# Patient Record
Sex: Female | Born: 1955
Health system: Southern US, Community
[De-identification: ages and names within clinical notes are randomized; demographics above are authoritative.]

## PROBLEM LIST (undated history)

## (undated) DIAGNOSIS — Z86010 Personal history of colonic polyps: Secondary | ICD-10-CM

## (undated) DIAGNOSIS — E785 Hyperlipidemia, unspecified: Secondary | ICD-10-CM

## (undated) DIAGNOSIS — K5792 Diverticulitis of intestine, part unspecified, without perforation or abscess without bleeding: Secondary | ICD-10-CM

## (undated) DIAGNOSIS — U071 COVID-19: Secondary | ICD-10-CM

## (undated) DIAGNOSIS — O159 Eclampsia, unspecified as to time period: Secondary | ICD-10-CM

## (undated) DIAGNOSIS — I10 Essential (primary) hypertension: Secondary | ICD-10-CM

## (undated) DIAGNOSIS — R011 Cardiac murmur, unspecified: Secondary | ICD-10-CM

## (undated) DIAGNOSIS — C801 Malignant (primary) neoplasm, unspecified: Secondary | ICD-10-CM

## (undated) HISTORY — DX: Eclampsia, unspecified as to time period: O15.9

## (undated) HISTORY — DX: Personal history of colonic polyps: Z86.010

## (undated) HISTORY — PX: HERNIA REPAIR: SHX51

## (undated) HISTORY — PX: POLYPECTOMY: SHX149

## (undated) HISTORY — PX: COLONOSCOPY: SHX174

## (undated) HISTORY — DX: Malignant (primary) neoplasm, unspecified: C80.1

## (undated) HISTORY — PX: OTHER SURGICAL HISTORY: SHX169

## (undated) HISTORY — DX: COVID-19: U07.1

## (undated) HISTORY — DX: Diverticulitis of intestine, part unspecified, without perforation or abscess without bleeding: K57.92

## (undated) HISTORY — DX: Hyperlipidemia, unspecified: E78.5

## (undated) HISTORY — DX: Cardiac murmur, unspecified: R01.1

## (undated) HISTORY — DX: Essential (primary) hypertension: I10

---

## 2020-11-03 DIAGNOSIS — H527 Unspecified disorder of refraction: Secondary | ICD-10-CM | POA: Diagnosis not present

## 2020-11-29 ENCOUNTER — Encounter: Payer: Self-pay | Admitting: Internal Medicine

## 2020-11-29 ENCOUNTER — Ambulatory Visit (INDEPENDENT_AMBULATORY_CARE_PROVIDER_SITE_OTHER): Payer: Medicare Other | Admitting: Internal Medicine

## 2020-11-29 VITALS — BP 143/87 | HR 99 | Temp 98.5°F | Wt 203.5 lb

## 2020-11-29 DIAGNOSIS — Z1211 Encounter for screening for malignant neoplasm of colon: Secondary | ICD-10-CM

## 2020-11-29 DIAGNOSIS — S0093XA Contusion of unspecified part of head, initial encounter: Secondary | ICD-10-CM | POA: Diagnosis not present

## 2020-11-29 DIAGNOSIS — Z0001 Encounter for general adult medical examination with abnormal findings: Secondary | ICD-10-CM

## 2020-11-29 DIAGNOSIS — I10 Essential (primary) hypertension: Secondary | ICD-10-CM | POA: Insufficient documentation

## 2020-11-29 DIAGNOSIS — E782 Mixed hyperlipidemia: Secondary | ICD-10-CM

## 2020-11-29 DIAGNOSIS — Z91030 Bee allergy status: Secondary | ICD-10-CM | POA: Insufficient documentation

## 2020-11-29 DIAGNOSIS — S0093XD Contusion of unspecified part of head, subsequent encounter: Secondary | ICD-10-CM

## 2020-11-29 DIAGNOSIS — R030 Elevated blood-pressure reading, without diagnosis of hypertension: Secondary | ICD-10-CM | POA: Diagnosis not present

## 2020-11-29 DIAGNOSIS — E785 Hyperlipidemia, unspecified: Secondary | ICD-10-CM | POA: Insufficient documentation

## 2020-11-29 HISTORY — DX: Contusion of unspecified part of head, subsequent encounter: S00.93XD

## 2020-11-29 HISTORY — DX: Encounter for screening for malignant neoplasm of colon: Z12.11

## 2020-11-29 MED ORDER — EPINEPHRINE 0.3 MG/0.3ML IJ SOAJ: 0.3000 mg | INTRAMUSCULAR | 3 refills | Status: DC | PRN

## 2020-11-29 NOTE — Assessment & Plan Note (Signed)
Mentions request for referral for colonoscopy. Mentions her previous GI physician recommended repeat colonoscopyi n 5 years. Previous record show 2 polyps removed during colonoscopy 5 years prior. Unable to find pathology result. Will place referral for GI  - Referral to GI for colonoscopy

## 2020-11-29 NOTE — Assessment & Plan Note (Addendum)
Ms.Gladu is a 65 yo F presenting to [facility] to establish care. She states that she recently moved from [place] and is looking to establish with a primary care provider. She takes no medications currently at home. She states she is severely allergic to bee venom and keeps epi-pen in variety of locations, including her car and golf bag. She requests refill of the epi-pens.  - Allergy list update - Epi-pen script provided

## 2020-11-29 NOTE — Assessment & Plan Note (Signed)
BP Readings from Last 3 Encounters:  11/29/20 (!) 143/87   Noted to have elevated bp this visit. Mentions at home readings previously of around 091U systolic. Denies any chest pain, palpitations, blurry vision. Discussed current ASCVD risk score (8%) and option of starting anti-hypertensive therapy to reduce risk. Miranda Lopez expressed interest in attempting lifestyle modifications first.   - Information on DASH provided - Monitor

## 2020-11-29 NOTE — Patient Instructions (Signed)
Dear Miranda Lopez,  Thank you for allowing [place] to provide your care today. Today we discussed your epi pen and need for colonoscopy    I have ordered no labs for you. I will call if any are abnormal.    Today we made no changes to your medications:    Please follow-up in 6 months.    Please call the internal medicine center clinic if you have any questions or concerns, we may be able to help and keep you from a long and expensive emergency room wait. Our clinic and after hours phone number is [phone], the best time to call is Monday through Friday 9 am to 4 pm but there is always someone available 24/7 if you have an emergency. If you need medication refills please notify your pharmacy one week in advance and they will send [place] a request.    If you have not gotten the COVID vaccine, I recommend doing so:  You may get it at your local [facility] or [facility] OR To schedule an appointment for a COVID vaccine or be added to the vaccine wait list: Go to [email or web address]   OR Go to [email or web address]                  OR Call [phone]                                     OR Call [phone] and select Option 2  Thank you for choosing [facility]   [email or web address]  DASH Eating Plan DASH stands for Dietary Approaches to Stop Hypertension. The DASH eating plan is a healthy eating plan that has been shown to:  Reduce high blood pressure (hypertension).  Reduce your risk for type 2 diabetes, heart disease, and stroke.  Help with weight loss. What are tips for following this plan? Reading food labels  Check food labels for the amount of salt (sodium) per serving. Choose foods with less than 5 percent of the Daily Value of sodium. Generally, foods with less than 300 milligrams (mg) of sodium per serving fit into this eating plan.  To find whole grains, look for the word "whole" as the first word in the  ingredient list. Shopping  Buy products labeled as "low-sodium" or "no salt added."  Buy fresh foods. Avoid canned foods and pre-made or frozen meals. Cooking  Avoid adding salt when cooking. Use salt-free seasonings or herbs instead of table salt or sea salt. Check with your health care provider or pharmacist before using salt substitutes.  Do not fry foods. Cook foods using healthy methods such as baking, boiling, grilling, roasting, and broiling instead.  Cook with heart-healthy oils, such as olive, canola, avocado, soybean, or sunflower oil. Meal planning  Eat a balanced diet that includes: ? 4 or more servings of fruits and 4 or more servings of vegetables each day. Try to fill one-half of your plate with fruits and vegetables. ? 6-8 servings of whole grains each day. ? Less than 6 oz (170 g) of lean meat, poultry, or fish each day. A 3-oz (85-g) serving of meat is about the same size as a deck of cards. One egg equals 1 oz (28 g). ? 2-3 servings of low-fat dairy each day. One serving is 1 cup (237 mL). ? 1 serving of nuts, seeds, or beans 5 times each week. ? 2-3 servings of heart-healthy fats. Healthy fats called  omega-3 fatty acids are found in foods such as walnuts, flaxseeds, fortified milks, and eggs. These fats are also found in cold-water fish, such as sardines, salmon, and mackerel.  Limit how much you eat of: ? Canned or prepackaged foods. ? Food that is high in trans fat, such as some fried foods. ? Food that is high in saturated fat, such as fatty meat. ? Desserts and other sweets, sugary drinks, and other foods with added sugar. ? Full-fat dairy products.  Do not salt foods before eating.  Do not eat more than 4 egg yolks a week.  Try to eat at least 2 vegetarian meals a week.  Eat more home-cooked food and less restaurant, buffet, and fast food.   Lifestyle  When eating at a restaurant, ask that your food be prepared with less salt or no salt, if  possible.  If you drink alcohol: ? Limit how much you use to:  0-1 drink a day for women who are not pregnant.  0-2 drinks a day for men. ? Be aware of how much alcohol is in your drink. In the [place] one drink equals one 12 oz bottle of beer (355 mL), one 5 oz glass of wine (148 mL), or one 1 oz glass of hard liquor (44 mL). General information  Avoid eating more than 2,300 mg of salt a day. If you have hypertension, you may need to reduce your sodium intake to 1,500 mg a day.  Work with your health care provider to maintain a healthy body weight or to lose weight. Ask what an ideal weight is for you.  Get at least 30 minutes of exercise that causes your heart to beat faster (aerobic exercise) most days of the week. Activities may include walking, swimming, or biking.  Work with your health care provider or dietitian to adjust your eating plan to your individual calorie needs. What foods should I eat? Fruits All fresh, dried, or frozen fruit. Canned fruit in natural juice (without added sugar). Vegetables Fresh or frozen vegetables (raw, steamed, roasted, or grilled). Low-sodium or reduced-sodium tomato and vegetable juice. Low-sodium or reduced-sodium tomato sauce and tomato paste. Low-sodium or reduced-sodium canned vegetables. Grains Whole-grain or whole-wheat bread. Whole-grain or whole-wheat pasta. Brown rice. [place]. Bulgur. Whole-grain and low-sodium cereals. Pita bread. Low-fat, low-sodium crackers. Whole-wheat flour tortillas. Meats and other proteins Skinless chicken or [place]. Ground chicken or [place]. Pork with fat trimmed off. Fish and seafood. Egg whites. Dried beans, peas, or lentils. Unsalted nuts, nut butters, and seeds. Unsalted canned beans. Lean cuts of beef with fat trimmed off. Low-sodium, lean precooked or cured meat, such as sausages or meat loaves. Dairy Low-fat (1%) or fat-free (skim) milk. Reduced-fat, low-fat, or fat-free cheeses. Nonfat, low-sodium  ricotta or cottage cheese. Low-fat or nonfat yogurt. Low-fat, low-sodium cheese. Fats and oils Soft margarine without trans fats. Vegetable oil. Reduced-fat, low-fat, or light mayonnaise and salad dressings (reduced-sodium). Canola, safflower, olive, avocado, soybean, and sunflower oils. Avocado. Seasonings and condiments Herbs. Spices. Seasoning mixes without salt. Other foods Unsalted popcorn and pretzels. Fat-free sweets. The items listed above may not be a complete list of foods and beverages you can eat. Contact a dietitian for more information. What foods should I avoid? Fruits Canned fruit in a light or heavy syrup. Fried fruit. Fruit in cream or butter sauce. Vegetables Creamed or fried vegetables. Vegetables in a cheese sauce. Regular canned vegetables (not low-sodium or reduced-sodium). Regular canned tomato sauce and paste (not low-sodium or reduced-sodium). Regular  tomato and vegetable juice (not low-sodium or reduced-sodium). Angie Fava. Olives. Grains Baked goods made with fat, such as croissants, muffins, or some breads. Dry pasta or rice meal packs. Meats and other proteins Fatty cuts of meat. Ribs. Fried meat. Berniece Salines. Bologna, salami, and other precooked or cured meats, such as sausages or meat loaves. Fat from the back of a pig (fatback). Bratwurst. Salted nuts and seeds. Canned beans with added salt. Canned or smoked fish. Whole eggs or egg yolks. Chicken or [place] with skin. Dairy Whole or 2% milk, cream, and half-and-half. Whole or full-fat cream cheese. Whole-fat or sweetened yogurt. Full-fat cheese. Nondairy creamers. Whipped toppings. Processed cheese and cheese spreads. Fats and oils Butter. Stick margarine. Lard. Shortening. Ghee. Bacon fat. Tropical oils, such as coconut, palm kernel, or palm oil. Seasonings and condiments Onion salt, garlic salt, seasoned salt, table salt, and sea salt. Worcestershire sauce. Tartar sauce. Barbecue sauce. Teriyaki sauce. Soy sauce,  including reduced-sodium. Steak sauce. Canned and packaged gravies. Fish sauce. Oyster sauce. Cocktail sauce. Store-bought horseradish. Ketchup. Mustard. Meat flavorings and tenderizers. Bouillon cubes. Hot sauces. Pre-made or packaged marinades. Pre-made or packaged taco seasonings. Relishes. Regular salad dressings. Other foods Salted popcorn and pretzels. The items listed above may not be a complete list of foods and beverages you should avoid. Contact a dietitian for more information. Where to find more information  National Heart, Lung, and Blood Institute: https://wilson-eaton.com/  American Heart Association: www.heart.org  Academy of Nutrition and Dietetics: www.eatright.[place]: www.kidney.org Summary  The DASH eating plan is a healthy eating plan that has been shown to reduce high blood pressure (hypertension). It may also reduce your risk for type 2 diabetes, heart disease, and stroke.  When on the DASH eating plan, aim to eat more fresh fruits and vegetables, whole grains, lean proteins, low-fat dairy, and heart-healthy fats.  With the DASH eating plan, you should limit salt (sodium) intake to 2,300 mg a day. If you have hypertension, you may need to reduce your sodium intake to 1,500 mg a day.  Work with your health care provider or dietitian to adjust your eating plan to your individual calorie needs. This information is not intended to replace advice given to you by your health care provider. Make sure you discuss any questions you have with your health care provider. Document Revised: 07/16/2019 Document Reviewed: 07/16/2019 Elsevier Patient Education  2021 [employer].

## 2020-11-29 NOTE — Assessment & Plan Note (Addendum)
She mentions that 10 days prior, she hit side of her head against a chair. Denies any loc, blurry vision, headaches. Denies any significant bruising or swelling. She states she wanted physician to be aware due to fear of intracranial bleed. She denies any pain.  A/P No evidence of significant trauma or neurological sequelae - Monitor

## 2020-11-29 NOTE — Assessment & Plan Note (Signed)
06/27/20 Total Cholesterol 220 HDL 75 Triglycerides 95 LDL-C 125  Records show LDL above 100. 10-year ASCVD risk calculated to be 8%. Discussed with Miranda Lopez regarding option of starting moderate intensity statin vs lifestyle modifications. Miranda Lopez states she wishes to continue to lose weight and change diet for treatment for now.  - Advised on lifestyle modifications for HLD

## 2020-11-29 NOTE — Progress Notes (Signed)
   CC: Need for colon cancer screening  HPI: Miranda Lopez is a 65 y.o. with PMH listed below presenting to [facility] to establish care. Please see problem based assessment and plan for further details.  Family History: Aunt had breast cancer. No other clinically significant family history.  Social History: Recently moved from [place]. 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 Has two dogs. 2 drinks daily. Denies tobacco, illicit substance use.  Review of Systems: Review of Systems  Constitutional: Negative for chills, fever and malaise/fatigue.  Eyes: Negative for blurred vision.  Respiratory: Negative for shortness of breath.   Cardiovascular: Negative for chest pain.  Gastrointestinal: Negative for constipation, diarrhea, nausea and vomiting.  Neurological: Negative for dizziness, sensory change and headaches.  All other systems reviewed and are negative.   Physical Exam: Vitals:   11/29/20 1330  BP: (!) 143/87  Pulse: 99  Temp: 98.5 F (36.9 C)  TempSrc: Oral  SpO2: 97%  Weight: 203 lb 8 oz (92.3 kg)   Gen: Well-developed, well nourished, NAD HEENT: NCAT head, hearing intact CV: RRR, S1, S2 normal Pulm: CTAB, No rales, no wheezes Extm: ROM intact, Peripheral pulses intact, No peripheral edema Skin: Dry, Warm, normal turgor  Assessment & Plan:   Contusion of head, initial encounter She mentions that 10 days prior, she hit side of her head against a chair. Denies any loc, blurry vision, headaches. Denies any significant bruising or swelling. She states she wanted physician to be aware due to fear of intracranial bleed. She denies any pain.  A/P No evidence of significant trauma or neurological sequelae - Monitor    Screening for colon cancer Mentions request for referral for colonoscopy. Mentions her previous GI physician recommended repeat colonoscopyi n 5 years. Previous record show 2 polyps removed during colonoscopy 5 years  prior. Unable to find pathology result. Will place referral for GI  - Referral to GI for colonoscopy  Moderate mixed hyperlipidemia not requiring statin therapy 06/27/20 Total Cholesterol 220 HDL 75 Triglycerides 95 LDL-C 125  Records show LDL above 100. 10-year ASCVD risk calculated to be 8%. Discussed with Miranda Lopez regarding option of starting moderate intensity statin vs lifestyle modifications. Miranda Lopez states she wishes to continue to lose weight and change diet for treatment for now.  - Advised on lifestyle modifications for HLD  Elevated BP without diagnosis of hypertension BP Readings from Last 3 Encounters:  11/29/20 (!) 143/87   Noted to have elevated bp this visit. Mentions at home readings previously of around 277A systolic. Denies any chest pain, palpitations, blurry vision. Discussed current ASCVD risk score (8%) and option of starting anti-hypertensive therapy to reduce risk. Miranda Lopez expressed interest in attempting lifestyle modifications first.   - Information on DASH provided - Monitor  Allergy to honey bee venom Miranda Lopez is a 65 yo F presenting to [facility] to establish care. She states that she recently moved from [place] and is looking to establish with a primary care provider. She takes no medications currently at home. She states she is severely allergic to bee venom and keeps epi-pen in variety of locations, including her car and golf bag. She requests refill of the epi-pens.  - Allergy list update - Epi-pen script provided    Patient discussed with Dr. Evette Doffing  -Gilberto Better, [facility] Pager: [phone]

## 2020-11-30 LAB — BMP8+ANION GAP
Anion Gap: 16 mmol/L (ref 10.0–18.0)
BUN/Creatinine Ratio: 19 (ref 12–28)
BUN: 19 mg/dL (ref 8–27)
CO2: 21 mmol/L (ref 20–29)
Calcium: 10.1 mg/dL (ref 8.7–10.3)
Chloride: 101 mmol/L (ref 96–106)
Creatinine, Ser: 0.99 mg/dL (ref 0.57–1.00)
Glucose: 91 mg/dL (ref 65–99)
Potassium: 4.4 mmol/L (ref 3.5–5.2)
Sodium: 138 mmol/L (ref 134–144)
eGFR: 62 mL/min/{1.73_m2} (ref >59)

## 2020-11-30 LAB — CBC
Hematocrit: 43.4 % (ref 34.0–46.6)
Hemoglobin: 14.1 g/dL (ref 11.1–15.9)
MCH: 31 pg (ref 26.6–33.0)
MCHC: 32.5 g/dL (ref 31.5–35.7)
MCV: 95 fL (ref 79–97)
Platelets: 239 10*3/uL (ref 150–450)
RBC: 4.55 x10E6/uL (ref 3.77–5.28)
RDW: 12.2 % (ref 11.7–15.4)
WBC: 6.3 10*3/uL (ref 3.4–10.8)

## 2020-11-30 NOTE — Progress Notes (Signed)
Internal Medicine Clinic Attending  Case discussed with Dr. Lee  At the time of the visit.  We reviewed the resident's history and exam and pertinent patient test results.  I agree with the assessment, diagnosis, and plan of care documented in the resident's note.    

## 2020-12-14 ENCOUNTER — Encounter: Payer: Self-pay | Admitting: Internal Medicine

## 2020-12-14 ENCOUNTER — Ambulatory Visit (INDEPENDENT_AMBULATORY_CARE_PROVIDER_SITE_OTHER): Payer: Medicare Other | Admitting: Internal Medicine

## 2020-12-14 ENCOUNTER — Other Ambulatory Visit: Payer: Self-pay

## 2020-12-14 VITALS — BP 144/98 | HR 77 | Temp 97.9°F | Ht 67.0 in | Wt 205.4 lb

## 2020-12-14 DIAGNOSIS — M67441 Ganglion, right hand: Secondary | ICD-10-CM | POA: Diagnosis not present

## 2020-12-14 DIAGNOSIS — S0093XD Contusion of unspecified part of head, subsequent encounter: Secondary | ICD-10-CM | POA: Diagnosis not present

## 2020-12-14 DIAGNOSIS — L720 Epidermal cyst: Secondary | ICD-10-CM | POA: Diagnosis not present

## 2020-12-14 DIAGNOSIS — R030 Elevated blood-pressure reading, without diagnosis of hypertension: Secondary | ICD-10-CM

## 2020-12-14 NOTE — Assessment & Plan Note (Addendum)
Miranda Lopez is a 65 yo F w/ PMH of HLD, HTN presenting to [facility] for evaluation of 'head bump' She mentions she was bit by bugs 15 years ago with resulting nodules on both her scalp and her stomach. She mentions that both nodules have been present since then and she has noticed it waxes and wanes in size with time. Her stomach nodule was irritating the skin due to being on the belt line and it was removed by dermatology. She mentions recently noticing increasing size of her scalp nodule and requests evaluation due to fear of malignancy.  A/P Present with simple nodule on scalp. Physical exam suggest inclusion cyst. Reassured patient and advised referral to dermatology for removal for cosmetic purpose or if cyst becomes painful. Miranda Lopez expressed understanding but okay with observation for now. - Monitor

## 2020-12-14 NOTE — Progress Notes (Signed)
   CC: Head bump  HPI: [name] is a 65 y.o. with PMH listed below presenting with complaint of head bump. Please see problem based assessment and plan for further details.  No past medical history on file.  Review of Systems: Review of Systems  Constitutional: Negative for chills, fever and malaise/fatigue.  Eyes: Negative for blurred vision.  Respiratory: Negative for shortness of breath.   Cardiovascular: Negative for chest pain, palpitations and leg swelling.  Gastrointestinal: Negative for constipation, diarrhea, nausea and vomiting.  All other systems reviewed and are negative.   Physical Exam: Vitals:   12/14/20 0916  BP: (!) 144/98  Pulse: 77  Temp: 97.9 F (36.6 C)  TempSrc: Oral  SpO2: 100%  Weight: 205 lb 6.4 oz (93.2 kg)  Height: 5\' 7"  (1.702 m)   Gen: Well-developed, well nourished, NAD HEENT: NCAT head, hearing intact CV: RRR, S1, S2 normal Pulm: CTAB, No rales, no wheezes Extm: ROM intact, Peripheral pulses intact, No peripheral edema Skin: Dry, Warm, palpable, firm, mobile nodule on proximal interphalangeal joint of R middle finger and near plantar surface of R wrist, Firm non-mobile round nodule 1.5cm in diameter on scalp   Assessment & Plan:   Contusion of head, subsequent encounter Denies significant sequelae from head injury described 2 weeks prior. Mentions resolution of pain. Denies any nausea, vomiting, light-headedness  - Resolved  Epidermal inclusion cyst [name] is a 65 yo F w/ PMH of HLD, HTN presenting to [facility] for evaluation of 'head bump' She mentions she was bit by bugs 15 years ago with resulting nodules on both her scalp and her stomach. She mentions that both nodules have been present since then and she has noticed it waxes and wanes in size with time. Her stomach nodule was irritating the skin due to being on the belt line and it was removed by dermatology. She mentions recently noticing increasing size of her scalp  nodule and requests evaluation due to fear of malignancy.  A/P Present with simple nodule on scalp. Physical exam suggest inclusion cyst. Reassured patient and advised referral to dermatology for removal for cosmetic purpose or if cyst becomes painful. [name] expressed understanding but okay with observation for now. - Monitor  Elevated BP without diagnosis of hypertension BP Readings from Last 3 Encounters:  12/14/20 (!) 144/98  11/29/20 (!) 143/87   Presents with elevated blood pressure reading on two subsequent visits. Had discussion regarding need to start anti-hypertensive therapy to reduced ASCVD risk. [name] understands the risk but mentions wishing to continue with lifestyle modifications. She also mentions she had home blood pressure monitor at home states she will continue to monitor her progression.  A/P - Advised on lifestyle modifications vs starting anti-hypertensive therapy (not ready to start meds) - C/w monitor  Ganglion cyst of finger of right hand Presents with 'bumps' on her right finger. She mentions she plays piano as a hobby and noticing bumps on her finger over the last 2 weeks. She denies pain, irritation but mentions rubbing it due to noticing the bumps. She denies weakness or difficulty with range of motion with grasping.  A/P Physical exam consistent with ganglion cyst. Discussed option of monitoring vs referral to hand surgery for removal. [name] expressed understanding and wishes to observe for now. - Monitor    Patient discussed with Dr. Evette Doffing  -Gilberto Better, PGY3 [facility] Pager: [phone]

## 2020-12-14 NOTE — Assessment & Plan Note (Signed)
BP Readings from Last 3 Encounters:  12/14/20 (!) 144/98  11/29/20 (!) 143/87   Presents with elevated blood pressure reading on two subsequent visits. Had discussion regarding need to start anti-hypertensive therapy to reduced ASCVD risk. Miranda Lopez understands the risk but mentions wishing to continue with lifestyle modifications. She also mentions she had home blood pressure monitor at home states she will continue to monitor her progression.  A/P - Advised on lifestyle modifications vs starting anti-hypertensive therapy (not ready to start meds) - C/w monitor

## 2020-12-14 NOTE — Assessment & Plan Note (Signed)
Presents with 'bumps' on her right finger. She mentions she plays piano as a hobby and noticing bumps on her finger over the last 2 weeks. She denies pain, irritation but mentions rubbing it due to noticing the bumps. She denies weakness or difficulty with range of motion with grasping.  A/P Physical exam consistent with ganglion cyst. Discussed option of monitoring vs referral to hand surgery for removal. Miranda Lopez expressed understanding and wishes to observe for now. - Monitor

## 2020-12-14 NOTE — Patient Instructions (Signed)
Dear [name],  Thank you for allowing [place] to provide your care today. Today we discussed your head and hand nodules  I have ordered no labs for you. I will call if any are abnormal.    Today we made the following changes to your medications:    Please follow-up as needed.    Please call the internal medicine center clinic if you have any questions or concerns, we may be able to help and keep you from a long and expensive emergency room wait. Our clinic and after hours phone number is [phone], the best time to call is Monday through Friday 9 am to 4 pm but there is always someone available 24/7 if you have an emergency. If you need medication refills please notify your pharmacy one week in advance and they will send [place] a request.    [email or web address]  Ganglion Cyst  A ganglion cyst is a non-cancerous, fluid-filled lump of tissue that occurs near a joint, tendon, or ligament. The cyst grows out of a joint or the lining of a tendon or ligament. Ganglion cysts most often develop in the hand or wrist, but they can also develop in the shoulder, elbow, hip, knee, ankle, or foot. Ganglion cysts are ball-shaped or egg-shaped. Their size can range from the size of a pea to larger than a grape. Increased activity may cause the cyst to get bigger because more fluid starts to build up. What are the causes? The exact cause of this condition is not known, but it may be related to:  Inflammation or irritation around the joint.  An injury or tear in the layers of tissue around the joint (joint capsule).  Repetitive movements or overuse.  History of acute or repeated injury. What increases the risk? You are more likely to develop this condition if:  You are a female.  You are 55-23 years old. What are the signs or symptoms? The main symptom of this condition is a lump. It most often appears on the hand or  wrist. In many cases, there are no other symptoms, but a cyst can sometimes cause:  Tingling.  Pain or tenderness.  Numbness.  Weakness or loss of strength in the affected joint.  Decreased range of motion in the affected area of the body.   How is this diagnosed? Ganglion cysts are usually diagnosed based on a physical exam. Your health care provider will feel the lump and may shine a light next to it. If it is a ganglion cyst, the light will likely shine through it. Your health care provider may order an X-ray, ultrasound, MRI, or CT scan to rule out other conditions. How is this treated? Ganglion cysts often go away on their own without treatment. If you have pain or other symptoms, treatment may be needed. Treatment is also needed if the ganglion cyst limits your movement or if it gets infected. Treatment may include:  Wearing a brace or splint on your wrist or finger.  Taking anti-inflammatory medicine.  Having fluid drained from the lump with a needle (aspiration).  Getting an injection of medicine into the joint to decrease inflammation. This may be corticosteroids, ethanol, or hyaluronidase.  Having surgery to remove the ganglion cyst.  Placing a pad in your shoe or wearing shoes that will not rub against the cyst if it is on your foot. Follow these instructions at home:  Do not press on the ganglion cyst, poke it with a needle, or hit it.  Take over-the-counter and prescription medicines only as told by your health care provider.  If you have a brace or splint: ? Wear it as told by your health care provider. ? Remove it as told by your health care provider. Ask if you need to remove it when you take a shower or a bath.  Watch your ganglion cyst for any changes.  Keep all follow-up visits as told by your health care provider. This is important. Contact a health care provider if:  Your ganglion cyst becomes larger or more painful.  You have pus coming from the  lump.  You have weakness or numbness in the affected area.  You have a fever or chills. Get help right away if:  You have a fever and have any of these in the cyst area: ? Increased redness. ? Red streaks. ? Swelling. Summary  A ganglion cyst is a non-cancerous, fluid-filled lump that occurs near a joint, tendon, or ligament.  Ganglion cysts most often develop in the hand or wrist, but they can also develop in the shoulder, elbow, hip, knee, ankle, or foot.  Ganglion cysts often go away on their own without treatment. This information is not intended to replace advice given to you by your health care provider. Make sure you discuss any questions you have with your health care provider. Document Revised: 11/03/2019 Document Reviewed: 11/03/2019 Elsevier Patient Education  [place].     Epidermoid Cyst  An epidermoid cyst, also called an epidermal cyst, is a small lump under your skin. The cyst contains a substance called keratin. Do not try to pop or open the cyst yourself. What are the causes?  A blocked hair follicle.  A hair that curls and re-enters the skin instead of growing straight out of the skin.  A blocked pore.  Irritated skin.  An injury to the skin.  Certain conditions that are passed along from parent to child.  Human papillomavirus (HPV). This happens rarely when cysts occur on the bottom of the feet.  Long-term sun damage to the skin. What increases the risk?  Having acne.  Being female.  Having an injury to the skin.  Being past puberty.  Having certain conditions caused by genes (genetic disorder) What are the signs or symptoms? These cysts are usually harmless, but they can get infected. Symptoms of infection may include:  Redness.  Inflammation.  Tenderness.  Warmth.  Fever.  A bad-smelling substance that drains from the cyst.  Pus that drains from the cyst. How is this treated? In many cases, epidermoid cysts go away  on their own without treatment. If a cyst becomes infected, treatment may include:  Opening and draining the cyst, done by a doctor. After draining, you may need minor surgery to remove the rest of the cyst.  Antibiotic medicine.  Shots of medicines (steroids) that help to reduce inflammation.  Surgery to remove the cyst. Surgery may be done if the cyst: ? Becomes large. ? Bothers you. ? Has a chance of turning into cancer.  Do not try to open a cyst yourself. Follow these instructions at home: Medicines  Take over-the-counter and prescription medicines as told by your doctor.  If you were prescribed an antibiotic medicine, take it as told by your doctor. Do not stop taking it even if you start to feel better. General instructions  Keep the area around your cyst clean and dry.  Wear loose, dry clothing.  Avoid touching your cyst.  Check your cyst  every day for signs of infection. Check for: ? Redness, swelling, or pain. ? Fluid or blood. ? Warmth. ? Pus or a bad smell.  Keep all follow-up visits. How is this prevented?  Wear clean, dry, clothing.  Avoid wearing tight clothing.  Keep your skin clean and dry. Take showers or baths every day. Contact a doctor if:  Your cyst has symptoms of infection.  Your condition does not improve or gets worse.  You have a cyst that looks different from other cysts you have had.  You have a fever. Get help right away if:  Redness spreads from the cyst into the area close by. Summary  An epidermoid cyst is a small lump under your skin.  If a cyst becomes infected, treatment may include surgery to open and drain the cyst, or to remove it.  Take over-the-counter and prescription medicines only as told by your doctor.  Contact a doctor if your condition is not improving or is getting worse.  Keep all follow-up visits. This information is not intended to replace advice given to you by your health care provider. Make sure  you discuss any questions you have with your health care provider. Document Revised: 11/17/2019 Document Reviewed: 11/17/2019 Elsevier Patient Education  [place].

## 2020-12-14 NOTE — Assessment & Plan Note (Signed)
Denies significant sequelae from head injury described 2 weeks prior. Mentions resolution of pain. Denies any nausea, vomiting, light-headedness  - Resolved

## 2020-12-15 NOTE — Progress Notes (Signed)
Internal Medicine Clinic Attending  Case discussed with Dr. Lee  At the time of the visit.  We reviewed the resident's history and exam and pertinent patient test results.  I agree with the assessment, diagnosis, and plan of care documented in the resident's note.    

## 2020-12-19 ENCOUNTER — Telehealth: Payer: Self-pay | Admitting: Gastroenterology

## 2020-12-19 NOTE — Telephone Encounter (Signed)
Records reviewed 2 colon polyps (cecum and rectum) removed with cold biopsy forceps, diverticulosis of the left colon, internal hemorrhoids.   Pathology consistent with cecal polyp tubular adenoma and rectal polyp hyperplastic polyp.  Based on previous recommendations a 5-year colonoscopy would be recommended. We will move forward with the 5-year colonoscopy recall that was in from prior recommendations and then get patient up-to-date on new recommendations based on what is found.  Please move forward with scheduling direct colonoscopy unless patient wants to be seen in clinic first. Thanks. GM

## 2020-12-19 NOTE — Telephone Encounter (Signed)
Left message to call back  

## 2020-12-19 NOTE — Telephone Encounter (Signed)
Hi Dr. Rush Landmark, we have received a referral from patient's PCP for a repeat colon. Patient had colon in 2017. We were able to obtain only path results as patient stated that she could not get colon report. I will send you path results for review. Please advise on scheduling. Thank you.

## 2020-12-21 LAB — HEMOGLOBIN A1C: Hemoglobin A1C: 5.7

## 2021-01-25 ENCOUNTER — Other Ambulatory Visit: Payer: Self-pay | Admitting: Student

## 2021-01-25 DIAGNOSIS — H16142 Punctate keratitis, left eye: Secondary | ICD-10-CM | POA: Diagnosis not present

## 2021-02-27 ENCOUNTER — Encounter: Payer: Self-pay | Admitting: *Deleted

## 2021-03-02 ENCOUNTER — Other Ambulatory Visit: Payer: Self-pay

## 2021-03-02 ENCOUNTER — Ambulatory Visit (AMBULATORY_SURGERY_CENTER): Payer: Medicare Other | Admitting: *Deleted

## 2021-03-02 VITALS — Ht 67.0 in | Wt 190.0 lb

## 2021-03-02 DIAGNOSIS — Z86010 Personal history of colonic polyps: Secondary | ICD-10-CM

## 2021-03-02 NOTE — Progress Notes (Signed)

## 2021-03-13 ENCOUNTER — Encounter: Payer: Self-pay | Admitting: Gastroenterology

## 2021-03-13 ENCOUNTER — Telehealth: Payer: Self-pay | Admitting: Gastroenterology

## 2021-03-13 ENCOUNTER — Ambulatory Visit (AMBULATORY_SURGERY_CENTER): Payer: Medicare Other | Admitting: Gastroenterology

## 2021-03-13 ENCOUNTER — Other Ambulatory Visit: Payer: Self-pay

## 2021-03-13 VITALS — BP 126/90 | HR 68 | Temp 97.9°F | Resp 15 | Ht 67.0 in | Wt 190.0 lb

## 2021-03-13 DIAGNOSIS — D120 Benign neoplasm of cecum: Secondary | ICD-10-CM | POA: Diagnosis not present

## 2021-03-13 DIAGNOSIS — D122 Benign neoplasm of ascending colon: Secondary | ICD-10-CM | POA: Diagnosis not present

## 2021-03-13 DIAGNOSIS — D128 Benign neoplasm of rectum: Secondary | ICD-10-CM

## 2021-03-13 DIAGNOSIS — Z86010 Personal history of colonic polyps: Secondary | ICD-10-CM

## 2021-03-13 MED ORDER — SODIUM CHLORIDE 0.9 % IV SOLN: 500.0000 mL | Freq: Once | INTRAVENOUS | Status: DC

## 2021-03-13 NOTE — Telephone Encounter (Signed)
Thank you for the update. I am sorry that she is experiencing cough symptoms. Hopefully this will improve into tomorrow.  I do not expect that this is medication related. Miranda Lopez, can you please update me tomorrow as to how the patient is doing? GM

## 2021-03-13 NOTE — Patient Instructions (Signed)
Information on polyps, diverticulosis and hemorrhoids given to you today.  Await pathology results.  Resume previous diet and medications.  Eat a high fiber diet and use FiberCon 1-2 tablets by mouth each day.  YOU HAD AN ENDOSCOPIC PROCEDURE TODAY AT [facility]:   Refer to the procedure report that was given to you for any specific questions about what was found during the examination.  If the procedure report does not answer your questions, please call your gastroenterologist to clarify.  If you requested that your care partner not be given the details of your procedure findings, then the procedure report has been included in a sealed envelope for you to review at your convenience later.  YOU SHOULD EXPECT: Some feelings of bloating in the abdomen. Passage of more gas than usual.  Walking can help get rid of the air that was put into your GI tract during the procedure and reduce the bloating. If you had a lower endoscopy (such as a colonoscopy or flexible sigmoidoscopy) you may notice spotting of blood in your stool or on the toilet paper. If you underwent a bowel prep for your procedure, you may not have a normal bowel movement for a few days.  Please Note:  You might notice some irritation and congestion in your nose or some drainage.  This is from the oxygen used during your procedure.  There is no need for concern and it should clear up in a day or so.  SYMPTOMS TO REPORT IMMEDIATELY:  Following lower endoscopy (colonoscopy or flexible sigmoidoscopy):  Excessive amounts of blood in the stool  Significant tenderness or worsening of abdominal pains  Swelling of the abdomen that is new, acute  Fever of 100F or higher   For urgent or emergent issues, a gastroenterologist can be reached at any hour by calling [phone]. Do not use MyChart messaging for urgent concerns.    DIET:  We do recommend a small meal at first, but then you may proceed to your regular diet.   Drink plenty of fluids but you should avoid alcoholic beverages for 24 hours.  ACTIVITY:  You should plan to take it easy for the rest of today and you should NOT DRIVE or use heavy machinery until tomorrow (because of the sedation medicines used during the test).    FOLLOW UP: Our staff will call the number listed on your records 48-72 hours following your procedure to check on you and address any questions or concerns that you may have regarding the information given to you following your procedure. If we do not reach you, we will leave a message.  We will attempt to reach you two times.  During this call, we will ask if you have developed any symptoms of COVID 19. If you develop any symptoms (ie: fever, flu-like symptoms, shortness of breath, cough etc.) before then, please call [phone].  If you test positive for Covid 19 in the 2 weeks post procedure, please call and report this information to [place].    If any biopsies were taken you will be contacted by phone or by letter within the next 1-3 weeks.  Please call us at [phone] if you have not heard about the biopsies in 3 weeks.    SIGNATURES/CONFIDENTIALITY: You and/or your care partner have signed paperwork which will be entered into your electronic medical record.  These signatures attest to the fact that that the information above on your After Visit Summary has been reviewed and is  understood.  Full responsibility of the confidentiality of this discharge information lies with you and/or your care-partner.

## 2021-03-13 NOTE — Op Note (Signed)
[facility] Patient Name: Miranda Lopez Procedure Date: 03/13/2021 10:22 AM MRN: [id number] Endoscopist: Justice Britain , MD Age: 65 Referring MD:  Date of Birth: 05-19-1956 Gender: Female Account #: [id number] Procedure:                Colonoscopy Indications:              Surveillance: Personal history of adenomatous                            polyps on last colonoscopy 5 years ago Medicines:                Monitored Anesthesia Care Procedure:                Pre-Anesthesia Assessment:                           - Prior to the procedure, a History and Physical                            was performed, and patient medications and                            allergies were reviewed. The patient's tolerance of                            previous anesthesia was also reviewed. The risks                            and benefits of the procedure and the sedation                            options and risks were discussed with the patient.                            All questions were answered, and informed consent                            was obtained. Prior Anticoagulants: The patient has                            taken no previous anticoagulant or antiplatelet                            agents. ASA Grade Assessment: II - A patient with                            mild systemic disease. After reviewing the risks                            and benefits, the patient was deemed in                            satisfactory condition to undergo the procedure.  After obtaining informed consent, the colonoscope                            was passed under direct vision. Throughout the                            procedure, the patient's blood pressure, pulse, and                            oxygen saturations were monitored continuously. The                            CF [id number] #[id number] was introduced through the anus                            and advanced to  the the cecum, identified by                            appendiceal orifice and ileocecal valve. The                            colonoscopy was performed without difficulty. The                            patient tolerated the procedure. The quality of the                            bowel preparation was adequate. The ileocecal                            valve, appendiceal orifice, and rectum were                            photographed. Scope In: 10:36:37 AM Scope Out: 10:50:44 AM Scope Withdrawal Time: 0 hours 11 minutes 49 seconds  Total Procedure Duration: 0 hours 14 minutes 7 seconds  Findings:                 The digital rectal exam findings include                            hemorrhoids and skin tags. Pertinent negatives                            include no palpable rectal lesions.                           A moderate amount of semi-liquid stool was found in                            the entire colon, interfering with visualization.                            Lavage of the area was performed using copious  amounts, resulting in clearance with adequate                            visualization.                           Three sessile polyps were found in the rectum,                            ascending colon and cecum. The polyps were 2 to 4                            mm in size. These polyps were removed with a cold                            snare. Resection and retrieval were complete.                           Multiple small and large-mouthed diverticula were                            found in the recto-sigmoid colon, sigmoid colon and                            descending colon.                           Normal mucosa was found in the entire colon                            otherwise.                           Non-bleeding non-thrombosed external and internal                            hemorrhoids were found during retroflexion, during                             perianal exam and during digital exam. The                            hemorrhoids were Grade II (internal hemorrhoids                            that prolapse but reduce spontaneously). Complications:            No immediate complications. Estimated Blood Loss:     Estimated blood loss was minimal. Impression:               - Hemorrhoids found on digital rectal exam.                           - Stool in the entire examined colon - lavaged with  adequate visualization.                           - Three 2 to 4 mm polyps in the rectum, in the                            ascending colon and in the cecum, removed with a                            cold snare. Resected and retrieved.                           - Diverticulosis in the recto-sigmoid colon, in the                            sigmoid colon and in the descending colon.                           - Normal mucosa in the entire examined colon                            otherwise.                           - Non-bleeding non-thrombosed external and internal                            hemorrhoids. Recommendation:           - The patient will be observed post-procedure,                            until all discharge criteria are met.                           - Discharge patient to home.                           - Patient has a contact number available for                            emergencies. The signs and symptoms of potential                            delayed complications were discussed with the                            patient. Return to normal activities tomorrow.                            Written discharge instructions were provided to the                            patient.                           -  High fiber diet.                           - Use FiberCon 1-2 tablets PO daily.                           - Continue present medications.                           - Await pathology  results.                           - Repeat colonoscopy in 3/5/7 years for                            surveillance based on pathology results and                            findings of adenomatous tissue. Recommend 1-week of                            Miralax daily prior to procedure for next                            colonoscopy to optimize preparation.                           - The findings and recommendations were discussed                            with the patient. Justice Britain, MD 03/13/2021 10:57:35 AM

## 2021-03-13 NOTE — Progress Notes (Signed)
1035 Robinul 0.2 mg IV given due large amount of secretions upon assessment.  MD made aware, vss

## 2021-03-13 NOTE — Progress Notes (Signed)
Vital signs checked by:CW ? ?The medical and surgical history was reviewed and verified with the patient. ? ?

## 2021-03-13 NOTE — Progress Notes (Signed)
Called to room to assist during endoscopic procedure.  Patient ID and intended procedure confirmed with present staff. Received instructions for my participation in the procedure from the performing physician.  

## 2021-03-13 NOTE — Progress Notes (Signed)
Report given to PACU, vss 

## 2021-03-13 NOTE — Telephone Encounter (Signed)
After hours call  Colonoscopy with Dr. Rush Landmark earlier today. Has had a persistent cough following her colonoscopy today. Was concerned she was having a reaction to a medication. Denies fevers, chills, shortness of breath, or chest pain.   Procedure note reviewed. Procedure discussed with CRNA Wall by phone tonight. No aspiration or hypoxia.  Patient given Robinul for copious secretions and coughing that occurred during the colonoscopy.   No obvious explanation for cough based on procedure. Concurrent process must be considered. I recommended:  (1) Gargle with warm salt water (2) Hydration (3) Mucinex 400 mg q4 hours for a max of 2.4 grams daily (4) ER with progressive symptoms including the development of fever, SOB, or chest pain (5) I made myself available for any additional questions or concerns tonight

## 2021-03-14 NOTE — Telephone Encounter (Signed)
See My Chart message from the pt received today 7/20

## 2021-03-15 ENCOUNTER — Telehealth: Payer: Self-pay | Admitting: *Deleted

## 2021-03-15 NOTE — Telephone Encounter (Signed)
Have you developed a fever since your procedure? no  2.   Have you had an respiratory symptoms (SOB or cough) since your procedure? no  3.   Have you tested positive for COVID 19 since your procedure no  4.   Have you had any family members/close contacts diagnosed with the COVID 19 since your procedure?  no   If yes to any of these questions please route to Joylene John, RN and Joella Prince, RN  Follow up Call-  Call back number 03/13/2021  Post procedure Call Back phone  # [phone]  Permission to leave phone message Yes     Patient questions:  Do you have a fever, pain , or abdominal swelling? No. Pain Score  0 *  Have you tolerated food without any problems? Yes.    Have you been able to return to your normal activities? Yes.    Do you have any questions about your discharge instructions: Diet   No. Medications  No. Follow up visit  No.  Do you have questions or concerns about your Care? No.  Actions: * If pain score is 4 or above: No action needed, pain <4.

## 2021-03-17 ENCOUNTER — Encounter: Payer: Self-pay | Admitting: Internal Medicine

## 2021-03-17 DIAGNOSIS — Z86010 Personal history of colonic polyps: Secondary | ICD-10-CM | POA: Insufficient documentation

## 2021-03-18 ENCOUNTER — Encounter: Payer: Self-pay | Admitting: Gastroenterology

## 2021-08-31 ENCOUNTER — Other Ambulatory Visit: Payer: Self-pay | Admitting: Internal Medicine

## 2021-08-31 ENCOUNTER — Encounter: Payer: Self-pay | Admitting: *Deleted

## 2021-08-31 NOTE — Progress Notes (Signed)
Things That May Be Affecting Your Health:  Alcohol  Hearing loss  Pain    Depression  Home Safety  Sexual Health   Diabetes  Lack of physical activity  Stress   Difficulty with daily activities  Loneliness  Tiredness   Drug use  Medicines  Tobacco use   Falls  Motor Vehicle Safety  Weight   Food choices  Oral Health X Other - elevated BP and lipids    YOUR PERSONALIZED HEALTH PLAN : 1. Schedule your next subsequent Medicare Wellness visit in one year 2. Attend all of your regular appointments to address your medical issues 3. Complete the preventative screenings and services   Annual Wellness Visit   Medicare Covered Preventative Screenings and [place] Men and Women Who How Often Need? Date of Last Service Action  Abdominal Aortic Aneurysm Adults with AAA risk factors Once      Alcohol Misuse and Counseling All Adults Screening once a year if no alcohol misuse. Counseling up to 4 face to face sessions.     Bone Density Measurement  Adults at risk for osteoporosis Once every 2 yrs ? Noted 07/03/19, no results available  Obtain records of prior DEXA scan.  Lipid Panel Z13.6 All adults without CV disease Once every 5 yrs No      Colorectal Cancer  Stool sample or Colonoscopy All adults 4 and older  Once every year Every 10 years No 03/13/21      Depression All Adults Once a year Yes Today   Diabetes Screening Blood glucose, post glucose load, or GTT Z13.1 All adults at risk Pre-diabetics Once per year Twice per year No     Diabetes  Self-Management Training All adults Diabetics 10 hrs first year; 2 hours subsequent years. Requires Copay     Glaucoma Diabetics Family history of glaucoma African Americans 42 yrs + Hispanic Americans 65 yrs + Annually - requires coppay      Hepatitis C Z72.89 or F19.20 High Risk for HCV Born between [place] Annually Once Yes N/a  Order hepatitis C antibody.  HIV Z11.4 All adults based on risk Annually btw  ages 16 & 50 regardless of risk Annually > 65 yrs if at increased risk Yes N/a  Order HIV screening.  Lung Cancer Screening Asymptomatic adults aged 17-77 with 31 pack yr history and current smoker OR quit within the last 15 yrs Annually Must have counseling and shared decision making documentation before first screen      Medical Nutrition Therapy Adults with  Diabetes Renal disease Kidney transplant within past 3 yrs 3 hours first year; 2 hours subsequent years     Obesity and Counseling All adults Screening once a year Counseling if BMI 30 or higher  Today   Tobacco Use Counseling Adults who use tobacco  Up to 8 visits in one year     Vaccines Z23 Hepatitis B Influenza  Pneumonia  Adults  Once Once every flu season Two different vaccines separated by one year Yes N/a Will need annual influenza vaccine.   Next Annual Wellness Visit People with Medicare Every year  Today     [place] Women Who How Often Need  Date of Last Service Action  Mammogram  Z12.31 Women over 57 One baseline ages 107-39. Annually ager 48 yrs+ Yes 08/30/19  Order referral for mammogram.  Pap tests All women Annually if high risk. Every 2 yrs for normal risk women  Screening for cervical cancer with  Pap (Z01.419 nl or Z01.411abnl) & HPV Z11.51 Women aged 4 to 25 Once every 5 yrs ? No record of prior screening. Discuss prior screening and obtain records within the last 10 years for review. If no screening, will need to discuss continuing screening.  Screening pelvic and breast exams All women Annually if high risk. Every 2 yrs for normal risk women     Sexually Transmitted Diseases Chlamydia Gonorrhea Syphilis All at risk adults Annually for non pregnant females at increased risk         [place] Men Who How Ofter Need  Date of Last Service Action  Prostate Cancer - DRE & PSA Men over 50 Annually.  DRE might require a copay.        Sexually Transmitted  Diseases Syphilis All at risk adults Annually for men at increased risk      Health Maintenance List Health Maintenance  Topic Date Due   TETANUS/TDAP  Never done   Zoster Vaccines- Shingrix (1 of 2) Never done   COVID-19 Vaccine (4 - Booster) 06/19/2020   INFLUENZA VACCINE  Never done   MAMMOGRAM  08/29/2021   COLONOSCOPY (Pts 45-34yrs Insurance coverage will need to be confirmed)  03/13/2024   Pneumonia Vaccine 32+ Years old  Completed   DEXA SCAN  Completed   Hepatitis C Screening  Completed   HPV VACCINES  Aged Out    Charise Killian, MD

## 2021-08-31 NOTE — Progress Notes (Signed)

## 2021-09-24 ENCOUNTER — Other Ambulatory Visit: Payer: Self-pay | Admitting: Family

## 2021-10-08 ENCOUNTER — Other Ambulatory Visit: Payer: Self-pay | Admitting: Internal Medicine

## 2021-10-15 ENCOUNTER — Ambulatory Visit (HOSPITAL_COMMUNITY)
Admission: RE | Admit: 2021-10-15 | Discharge: 2021-10-15 | Disposition: A | Discharge status: Home / Self Care | Payer: PPO | Source: Ambulatory Visit | Attending: Internal Medicine | Admitting: Internal Medicine

## 2021-10-15 ENCOUNTER — Other Ambulatory Visit (HOSPITAL_COMMUNITY): Payer: Self-pay

## 2021-10-15 ENCOUNTER — Encounter: Payer: Self-pay | Admitting: Internal Medicine

## 2021-10-15 ENCOUNTER — Ambulatory Visit (INDEPENDENT_AMBULATORY_CARE_PROVIDER_SITE_OTHER): Payer: PPO | Admitting: Internal Medicine

## 2021-10-15 VITALS — BP 158/94 | HR 79 | Temp 98.4°F | Ht 67.0 in | Wt 209.0 lb

## 2021-10-15 DIAGNOSIS — E785 Hyperlipidemia, unspecified: Secondary | ICD-10-CM | POA: Diagnosis not present

## 2021-10-15 DIAGNOSIS — R002 Palpitations: Secondary | ICD-10-CM

## 2021-10-15 DIAGNOSIS — R7303 Prediabetes: Secondary | ICD-10-CM

## 2021-10-15 DIAGNOSIS — Z86010 Personal history of colonic polyps: Secondary | ICD-10-CM | POA: Diagnosis not present

## 2021-10-15 DIAGNOSIS — I10 Essential (primary) hypertension: Secondary | ICD-10-CM

## 2021-10-15 DIAGNOSIS — D4959 Neoplasm of unspecified behavior of other genitourinary organ: Secondary | ICD-10-CM | POA: Diagnosis not present

## 2021-10-15 DIAGNOSIS — L918 Other hypertrophic disorders of the skin: Secondary | ICD-10-CM

## 2021-10-15 DIAGNOSIS — E669 Obesity, unspecified: Secondary | ICD-10-CM | POA: Diagnosis not present

## 2021-10-15 DIAGNOSIS — Z1231 Encounter for screening mammogram for malignant neoplasm of breast: Secondary | ICD-10-CM | POA: Diagnosis not present

## 2021-10-15 DIAGNOSIS — R011 Cardiac murmur, unspecified: Secondary | ICD-10-CM | POA: Insufficient documentation

## 2021-10-15 DIAGNOSIS — Z860101 Personal history of adenomatous and serrated colon polyps: Secondary | ICD-10-CM

## 2021-10-15 DIAGNOSIS — Z23 Encounter for immunization: Secondary | ICD-10-CM | POA: Insufficient documentation

## 2021-10-15 DIAGNOSIS — I340 Nonrheumatic mitral (valve) insufficiency: Secondary | ICD-10-CM | POA: Insufficient documentation

## 2021-10-15 HISTORY — DX: Palpitations: R00.2

## 2021-10-15 NOTE — Assessment & Plan Note (Signed)
Weight 209, BMI 32.73 today. Miranda Lopez notes she would like to lose about 25 lbs with her most comfortable weight being around 180 lbs. She states it has been a while since she was at this weight. Dietary recall appears relatively healthy and well-balanced, however she was open to nutrition referral for further optimization. Stays active with hiking (up to 9 miles!), walking, gym, and golf. Sleeping 8-9 hours/night. Overall seems like she is doing a great job from a lifestyle standpoint. Discussed pharmacologic options, including GLP-1 agonist vs phentermine vs Orlistat. I would favor GLP-1 agonist, however will need to investigate whether this would be covered. Phentermine not a good option right now with uncontrolled BP. Other combination medications or off-label medications may be available and I will look into this further.

## 2021-10-15 NOTE — Assessment & Plan Note (Signed)
Patient reports history of cervical cancer. Discussed whether findings were cancer vs precancerous changes and Miranda Lopez was unsure but notes that she had three separate procedures with removal of 1/3 of the cervix each time. She believes her last Pap smear was about three years ago. Given history of abnormal findings, patient qualifies for extended screening past age 68. Will schedule early follow-up for Pap screening and will discuss obtaining prior records with referral to gynecology if needed. -Return within one month for Pap and gynecologic exam -Obtain prior gyn records

## 2021-10-15 NOTE — Assessment & Plan Note (Signed)
Last mammogram Birads-1 in 08/2019. Referral for annual mammogram sent today.

## 2021-10-15 NOTE — Patient Instructions (Addendum)
Thank you, Ms.Gordy Clement for allowing [place] to provide your care today. Today we discussed weight, heart palpitations, among others.  I have ordered the following labs for you:  Lab Orders         Lipid Profile         BMP8+Anion Gap      I will call if any are abnormal. All of your labs can be accessed through "My Chart".  I have place a referrals to nutrition therapy and cardiology. I have ordered an echocardiogram (heart ultrasound).   Please monitor your blood pressure at home and keep a log. Please bring this (and your blood pressure cuff) to your next visit for review. We will plan to complete your Pap smear at next visit.  Please contact your pharmacy about obtaining your shingles (Shingrix) and Tdap vaccine.  Should you have any questions or concerns please call the internal medicine clinic at [phone].    Charise Killian, MD [facility] Internal Medicine

## 2021-10-15 NOTE — Assessment & Plan Note (Signed)
Miranda Lopez reports intermittent episodes of palpitations over the last couple of months. At first, she notes these must be associated with anxiety as she is often able to do breathing exercises and calm down with resolution of palpitations. However, when questioned further she does not remember feeling particularly anxious before onset of palpitations and notes palpitations seem to come first sometimes. They can occur at rest and have not been associated with physical activity. She denies any chest pain, SOB, syncope, or lightheadedness. During an episode last week she noted she had "wavy lines" in her left eye that resolved on their own. She has not measured her HR during these episodes. EKG today with NSR and evidence of possible structural heart disease.Cardiac exam notable for regular rate and rhythm but systolic murmur that Miranda Lopez reports she has not been informed of before. Given palpitations precede anxiety symptoms, I worry that there may be a cardiac etiology at play. Will obtain TTE for structural problems and discussed referral to cardiology for possible monitor placement. -TTE -Referral to cardiology to discuss heart monitor

## 2021-10-15 NOTE — Assessment & Plan Note (Addendum)
Outside labs with A1c 5.7% in 2020 consistent with prediabetes. Miranda Lopez does note excess abdominal adiposity, as well as elevated BP and cholesterol, concerning for metabolic syndrome. Will recheck A1c today and plan for medical management if remains in prediabetes range. -Hemoglobin A1c

## 2021-10-15 NOTE — Assessment & Plan Note (Signed)
History of multiple prior colonoscopy with adenomatous polyps, last in 02/2021. Repeat recommended in 3 years. HM updated.

## 2021-10-15 NOTE — Progress Notes (Signed)
°  Subjective:   Patient ID: Miranda Lopez female   DOB: September 24, 1955 66 y.o.   MRN: [id number]  HPI: [name] is a 66 y.o. with history of prediabetes, cervical neoplasm, obesity, and hyperlipidemia who presents to discuss heart palpitations, weight, and healthcare maintenance.  Please see problem-based charting for further details.  Past Medical History:  Diagnosis Date   Cancer [facility])    cervical cancer   Contusion of head, subsequent encounter 11/29/2020   COVID-19    patient reported 07/2018   Diverticulitis    last epiosde 10-11 yrs ago   Eclampsia    Screening for colon cancer 11/29/2020   Hx of adenomatous colonic polyps   Current Outpatient Medications  Medication Sig Dispense Refill   EPINEPHrine 0.3 mg/0.3 mL IJ SOAJ injection Inject 0.3 mg into the muscle as needed for anaphylaxis. 4 each 3   No current facility-administered medications for this visit.   Family History  Problem Relation Age of Onset   Heart failure Father 54   CAD Father 67       Heart attack and quadruple bypass at age 55.   Colon cancer Neg Hx    Colon polyps Neg Hx    Esophageal cancer Neg Hx    Rectal cancer Neg Hx    Stomach cancer Neg Hx    Social History   Socioeconomic History   Marital status: Single    Spouse name: Not on file   Number of children: Not on file   Years of education: Not on file   Highest education level: Not on file  Occupational History   Not on file  Tobacco Use   Smoking status: Never   Smokeless tobacco: Never  Substance and Sexual Activity   Alcohol use: Yes    Alcohol/week: 7.0 standard drinks    Types: 7 Glasses of wine per week    Comment: wine with dinner nightly   Drug use: Not on file   Sexual activity: Not on file  Other Topics Concern   Not on file  Social History Narrative   Currently working, owns two businesses.   Social Determinants of Health   Financial Resource Strain: Not on file  Food Insecurity: Not on file   Transportation Needs: Not on file  Physical Activity: Not on file  Stress: Not on file  Social Connections: Not on file   Objective:  Physical Exam: Vitals:   10/15/21 1100  BP: (!) 158/94  Pulse: 79  Temp: 98.4 F (36.9 C)  TempSrc: Oral  SpO2: 98%  Weight: 209 lb (94.8 kg)  Height: 5\' 7"  (1.702 m)   General appearance: alert, cooperative, appears stated age, and no distress Lungs: clear to auscultation bilaterally and no increased effort or work of breathing Heart: regular rate and rhythm and systolic murmur: II-III/IV at 2nd right intercostal space and lower left sternal border Extremities: extremities normal, atraumatic, no cyanosis or edema Skin: two small, flesh-colored skin tags present in left axilla Neurologic: Grossly normal  EKG with NSR, evidence of left atrial enlargement and LVH on my review Assessment & Plan:   See Encounters tab for problem-based charting.  Return in about 4 weeks (around 11/12/2021) for f/u Pap.  Charise Killian, MD

## 2021-10-15 NOTE — Assessment & Plan Note (Addendum)
BP: (!) 158/94  Elevated blood pressure reading in clinic today, continued elevation upon recheck. Home readings reported to be 130-140s. Miranda Lopez notes she was on a BP medication about 35 years ago but took herself off when she lost weight. She is hesitant to take medication and notes she would prefer to lose weight and manage with lifestyle. She is very physically active and eats a healthy, balanced diet on recall--not sure how much more she can change from a lifestyle standpoint. We did discuss weight loss would likely be helpful, see "obesity" for further discussion, however in the short-term I would prefer to control BP with medication if needed and discontinue later if possible. She will monitor home pressures, keep a log, and bring this with cuff to next office visit in one month. -Monitor home BP, bring log and home cuff to visit in one month -Will likely need to initiate medication if pressures remain in current home and office range  Addendum: Home readings following visit ranging from systolic 179-150V. Average 150s. Discussed starting a BP medication with Miranda Lopez to reduce risk of heart/kidney disease, stroke, etc while working on weight loss and lifestyle change. She was agreeable and will start amlodipine 5 mg with continued monitoring before f/u visit.

## 2021-10-15 NOTE — Assessment & Plan Note (Addendum)
Systolic II-III/VI murmur heard over upper right sternal border and left lower sternal border. Miranda Lopez reports she has never been told she has a heart murmur before. She was told she had an "enlarged heart" on an xray in 2019 but was told this resolved on f/u. Her father had a history of CAD and heart failure which she notes was the result of an unhealthy lifestyle. EKG today with evidence of possible left atrial enlargement and LVH. [name] denies any s/sx of heart failure including DOE, orthopnea, or edema, however has noticed recent episodes of palpitations. No syncope or dizziness. Given recent palpitations, newly noted murmur, and EKG changes, we have discussed proceeding with TTE for further structural evaluation. -TTE -Palpitation as below

## 2021-10-15 NOTE — Assessment & Plan Note (Signed)
Outside lipid panel in 2021 with elevated LDL (125) and total cholesterol (220). Not currently on statin therapy, likely a candidate for at least moderate-intensity given family history of early heart disease in her father. Will recheck lipid panel today and discuss statin initiation. Miranda Lopez does note that she would prefer to enact lifestyle modification and attempt weight loss for cholesterol, BP, etc but will address again following labs. -Lipid panel, discuss at least moderate-intensity statin

## 2021-10-15 NOTE — Assessment & Plan Note (Signed)
Skin tags of the left axilla noted by patient. She is not currently bothered by them but wanted to make sure they were not concerning. Appeared to be benign, normal-appearing skin tags on exam today. Encouraged her to let me know if they are growing significantly or bothersome.

## 2021-10-16 ENCOUNTER — Telehealth: Payer: Self-pay | Admitting: *Deleted

## 2021-10-16 ENCOUNTER — Other Ambulatory Visit: Payer: Self-pay | Admitting: Internal Medicine

## 2021-10-16 DIAGNOSIS — Z1231 Encounter for screening mammogram for malignant neoplasm of breast: Secondary | ICD-10-CM

## 2021-10-16 DIAGNOSIS — R7303 Prediabetes: Secondary | ICD-10-CM

## 2021-10-16 LAB — BMP8+ANION GAP
Anion Gap: 16 mmol/L (ref 10.0–18.0)
BUN/Creatinine Ratio: 15 (ref 12–28)
BUN: 13 mg/dL (ref 8–27)
CO2: 22 mmol/L (ref 20–29)
Calcium: 9.5 mg/dL (ref 8.7–10.3)
Chloride: 96 mmol/L (ref 96–106)
Creatinine, Ser: 0.88 mg/dL (ref 0.57–1.00)
Glucose: 90 mg/dL (ref 70–99)
Potassium: 4.2 mmol/L (ref 3.5–5.2)
Sodium: 134 mmol/L (ref 134–144)
eGFR: 72 mL/min/{1.73_m2} (ref >59)

## 2021-10-16 LAB — LIPID PANEL
Chol/HDL Ratio: 3.1 ratio (ref 0.0–4.4)
Cholesterol, Total: 262 mg/dL — ABNORMAL HIGH (ref 100–199)
HDL: 85 mg/dL (ref >39)
LDL Chol Calc (NIH): 152 mg/dL — ABNORMAL HIGH (ref 0–99)
Triglycerides: 145 mg/dL (ref 0–149)
VLDL Cholesterol Cal: 25 mg/dL (ref 5–40)

## 2021-10-16 LAB — HEMOGLOBIN A1C
Est. average glucose Bld gHb Est-mCnc: 123 mg/dL
Hgb A1c MFr Bld: 5.9 % — ABNORMAL HIGH (ref 4.8–5.6)

## 2021-10-16 MED ORDER — SEMAGLUTIDE(0.25 OR 0.5MG/DOS) 2 MG/1.5ML ~~LOC~~ SOPN: 0.2500 mg | PEN_INJECTOR | SUBCUTANEOUS | 0 refills | Status: DC

## 2021-10-16 NOTE — Telephone Encounter (Signed)
Patient called stating she has some questions for Dr. Saverio Danker.  Please call patient.

## 2021-10-16 NOTE — Telephone Encounter (Signed)
Returned patient's phone call to discuss yesterday's visit. Reviewed EKG findings and recommendation to move forward with echocardiogram as discussed. Miranda Lopez was quite worried about whether she should continue exercising or needed to take any precautions. Based on current information, I advised her that I do not see any cause to make changes to her activities and encouraged her to continue her healthy lifestyle. We did discuss A1c findings of prediabetes and recommendation for semaglutide which may also help with weight loss. She is agreeable to weekly injection and would let me know if medicine was not affordable or she was uncomfortable with use. Advised on possible side effects and titration regimen. Discussed elevated cholesterol and ASCVD risk of 11.2%. Given additional risk factors we discussed that moderate-intensity statin therapy would be appropriate, however Miranda Lopez continues to want to try lifestyle/weight loss changes with lipid recheck before initiating pharmacologic therapy. Message sent to front desk staff to schedule f/u and nutrition appointment. Miranda Lopez was appreciative of the phone call and I encouraged her to call with any further concerns or questions.  Asked if Miranda Lopez would be able to obtain prior gynecology records for reported history of cervical cancer, however she notes that this first occurred when she was 66 yo [place] and multiple subsequent f/u visits were also overseas in various countries. Last procedure was done in [place] about 20 years ago and she does not have records. Discussed obtaining Pap and pelvic exam at our next visit, however may need to refer to gynecology given complicated history.

## 2021-10-17 ENCOUNTER — Encounter: Payer: Self-pay | Admitting: Internal Medicine

## 2021-10-17 ENCOUNTER — Ambulatory Visit
Admission: RE | Admit: 2021-10-17 | Discharge: 2021-10-17 | Disposition: A | Discharge status: Home / Self Care | Payer: PPO | Source: Ambulatory Visit

## 2021-10-17 DIAGNOSIS — Z1231 Encounter for screening mammogram for malignant neoplasm of breast: Secondary | ICD-10-CM

## 2021-10-17 IMAGING — MG MM DIGITAL SCREENING BILAT W/ TOMO AND CAD
8 series · 8 of 24 positions shown · non-contrast
Comparison: Previous exam(s).

CLINICAL DATA: Screening.

EXAM:
DIGITAL SCREENING BILATERAL MAMMOGRAM WITH TOMOSYNTHESIS AND CAD
TECHNIQUE: Bilateral screening digital craniocaudal and mediolateral oblique
mammograms were obtained. Bilateral screening digital breast
tomosynthesis was performed. The images were evaluated with
computer-aided detection.

[L CC synth-2D]
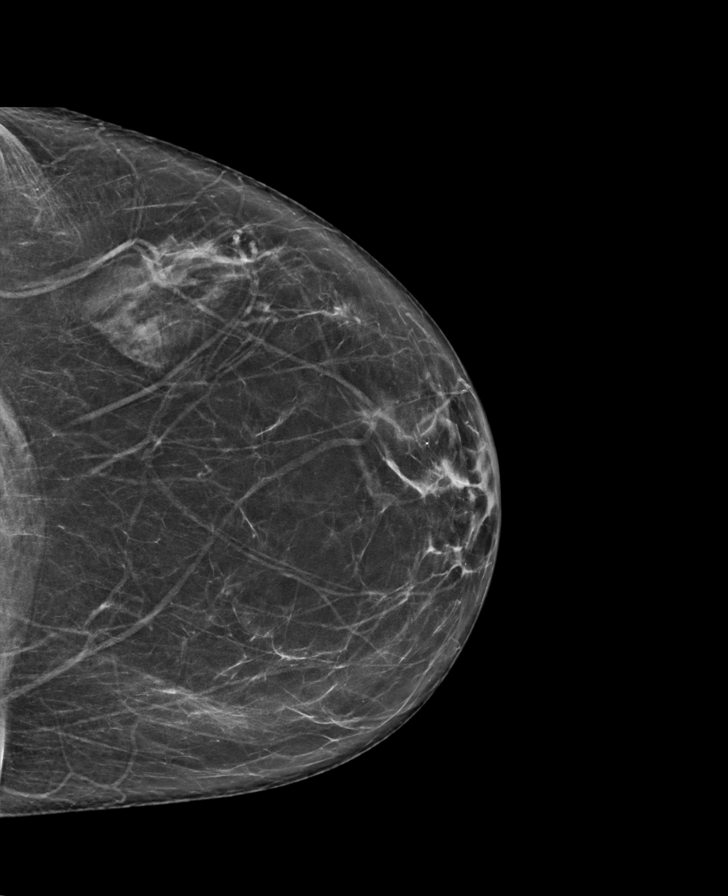

[L MLO synth-2D]
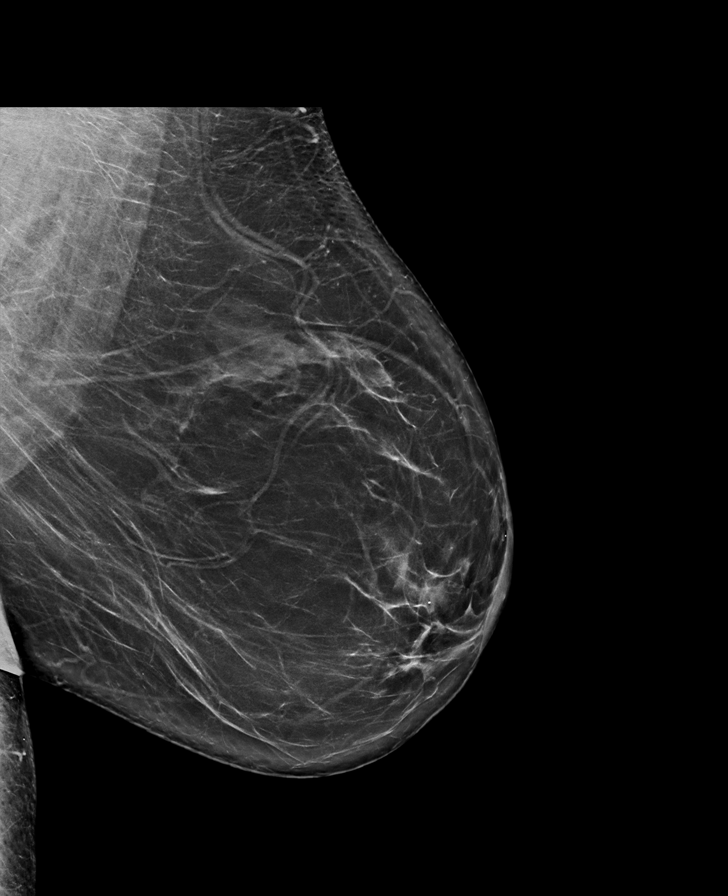

[R MLO synth-2D]
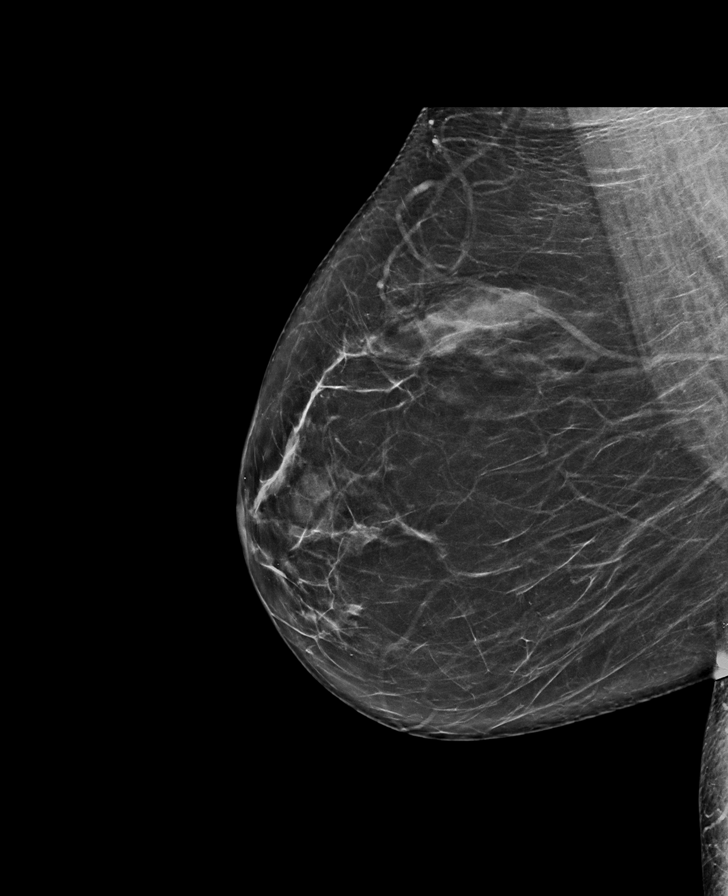

[R CC synth-2D]
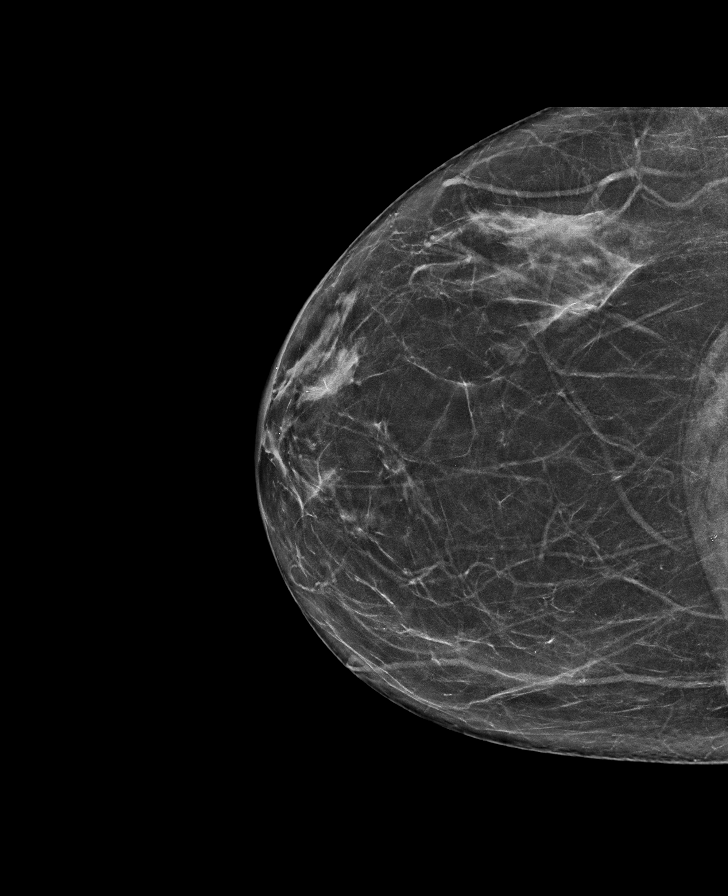

[L CC tomo · tomo slice 37/72.0]
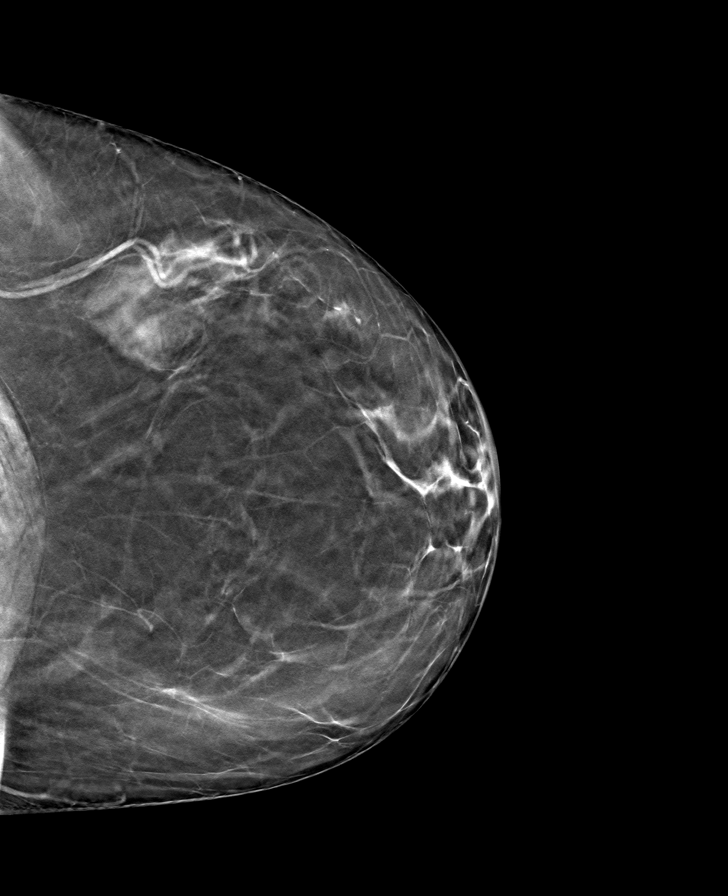

[L MLO tomo · tomo slice 43/84.0]
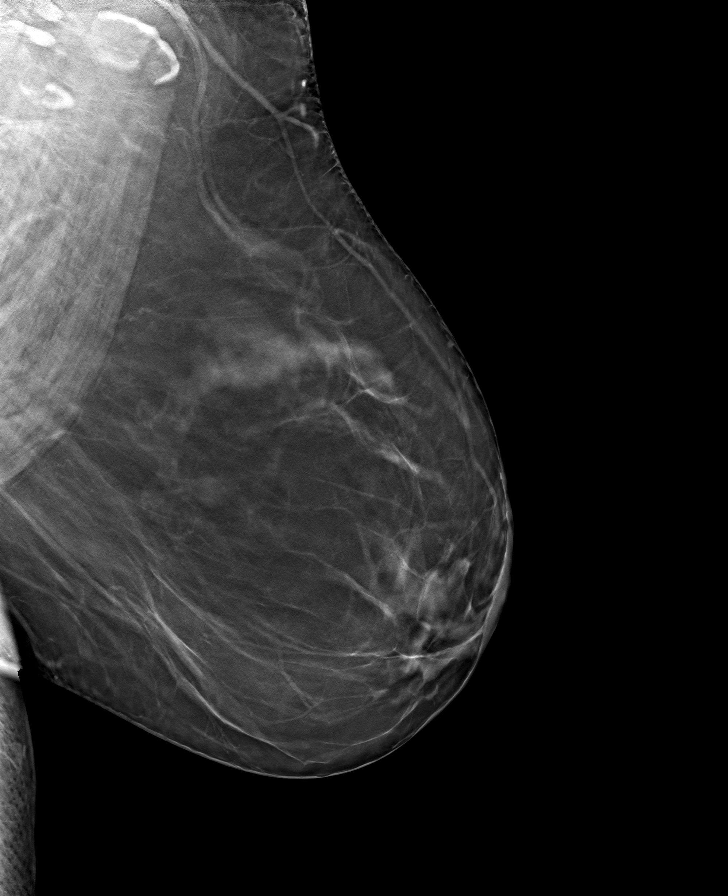

[R MLO tomo · tomo slice 39/77.0]
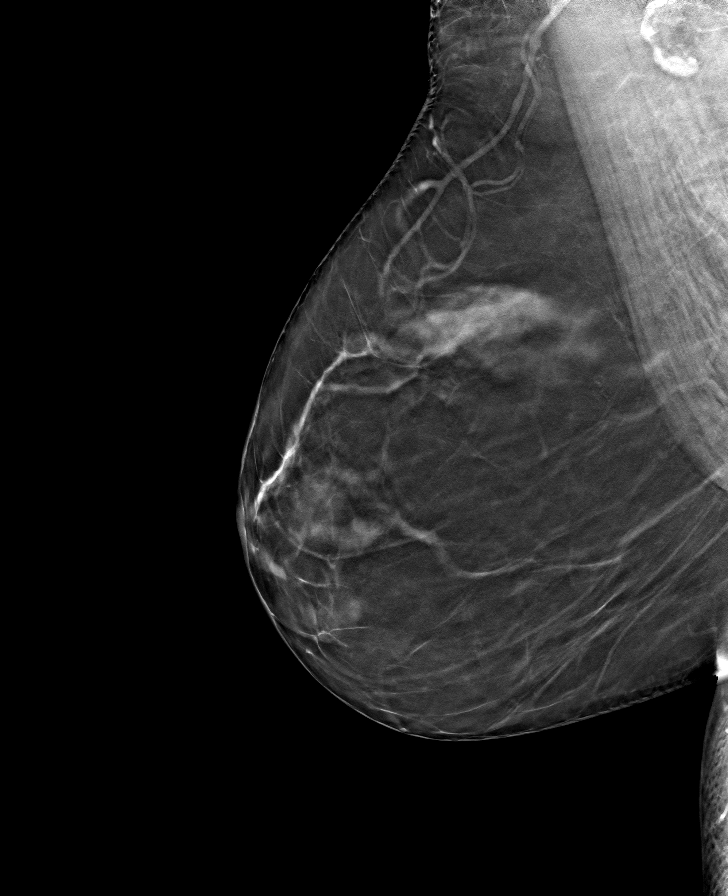

[R CC tomo · tomo slice 36/71.0]
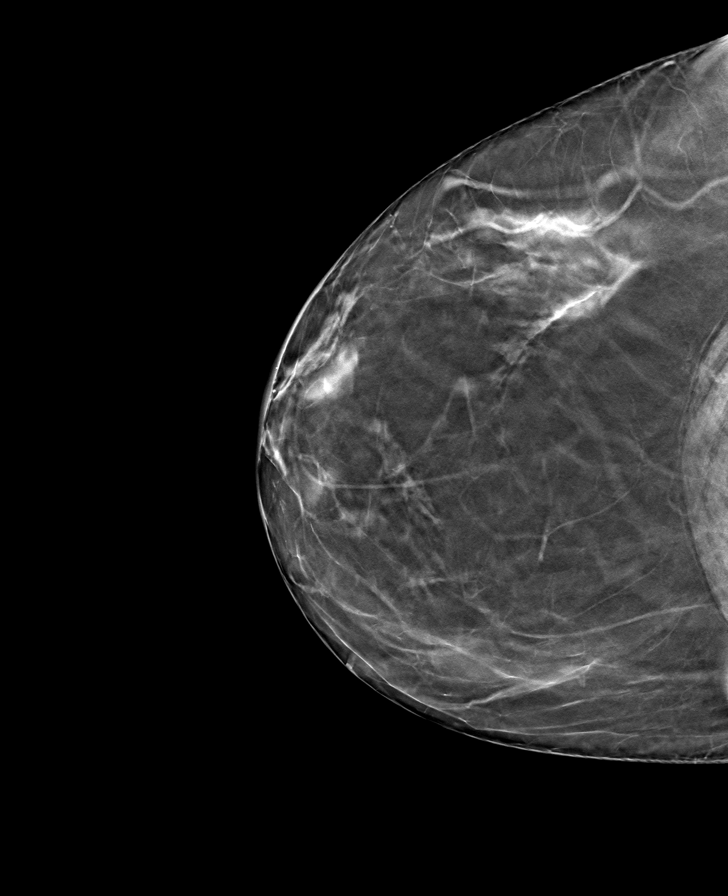

[8 of 24 positions shown; findings below may reference images not displayed]

ACR Breast Density Category b: There are scattered areas of
fibroglandular density.
FINDINGS: There are no findings suspicious for malignancy.
IMPRESSION: No mammographic evidence of malignancy. A result letter of this
screening mammogram will be mailed directly to the patient.

RECOMMENDATION:
Screening mammogram in one year. (Code:[BY])

BI-RADS CATEGORY  1: Negative.

## 2021-10-17 NOTE — Telephone Encounter (Signed)
Attempted to reach Ms. Vitiello to follow up on BP reported in today's MyChart message. She was unavailable and a voicemail was left informing her of the call. I will continue to attempt to reach her for further discussion.

## 2021-10-18 ENCOUNTER — Telehealth: Payer: Self-pay | Admitting: Internal Medicine

## 2021-10-18 MED ORDER — AMLODIPINE BESYLATE 5 MG PO TABS: 5.0000 mg | ORAL_TABLET | Freq: Every day | ORAL | 11 refills | Status: DC

## 2021-10-18 NOTE — Telephone Encounter (Signed)
Discussed BP with Miranda Lopez, please refer to A/P addendum from 10/15/21 for further details.

## 2021-10-18 NOTE — Addendum Note (Signed)
Addended by: Charise Killian on: 10/18/2021 11:41 AM   Modules accepted: Orders

## 2021-11-02 ENCOUNTER — Ambulatory Visit (HOSPITAL_COMMUNITY)
Admission: RE | Admit: 2021-11-02 | Discharge: 2021-11-02 | Disposition: A | Discharge status: Home / Self Care | Payer: PPO | Source: Ambulatory Visit | Attending: Internal Medicine | Admitting: Internal Medicine

## 2021-11-02 ENCOUNTER — Other Ambulatory Visit: Payer: Self-pay

## 2021-11-02 DIAGNOSIS — R002 Palpitations: Secondary | ICD-10-CM | POA: Diagnosis not present

## 2021-11-02 DIAGNOSIS — I10 Essential (primary) hypertension: Secondary | ICD-10-CM | POA: Diagnosis not present

## 2021-11-02 DIAGNOSIS — R011 Cardiac murmur, unspecified: Secondary | ICD-10-CM

## 2021-11-02 DIAGNOSIS — I081 Rheumatic disorders of both mitral and tricuspid valves: Secondary | ICD-10-CM | POA: Insufficient documentation

## 2021-11-02 LAB — ECHOCARDIOGRAM COMPLETE
AR max vel: 3.15 cm2
AV Peak grad: 8.4 mmHg
Ao pk vel: 1.45 m/s
Area-P 1/2: 2.19 cm2
S' Lateral: 2.4 cm

## 2021-11-02 NOTE — Progress Notes (Signed)
Echocardiogram ?2D Echocardiogram has been performed. ? ?Miranda Lopez ?11/02/2021, 2:30 PM ?

## 2021-11-05 ENCOUNTER — Telehealth: Payer: Self-pay | Admitting: Internal Medicine

## 2021-11-05 NOTE — Telephone Encounter (Signed)
Called Ms. Darko to review echo results, cardiology f/u scheduled 11/30/21. Follow-up with me next week, encouraged Ms. Malmberg to call with any questions prior to visit. ?

## 2021-11-06 ENCOUNTER — Telehealth: Payer: Self-pay | Admitting: Internal Medicine

## 2021-11-06 NOTE — Telephone Encounter (Signed)
Episode of vertigo earlier today, resolved within 5-7 minutes with lying down. Miranda Lopez reports she had a similar episode about 20 years ago and was given a medication to take but it never recurred. This episode associated with nausea, no chest pain. Does not sound positional as she was sitting during onset, unclear if triggered by head movement. She was unable to take her BP or HR during this episode, however she felt a little dizzy later and noted SBP 150s and HR 70s. Discussed reasons to present for further evaluation to ED, however will attempt to schedule acute visit tomorrow with resident team to further evaluate. ?

## 2021-11-07 ENCOUNTER — Encounter: Payer: Self-pay | Admitting: Student

## 2021-11-07 ENCOUNTER — Ambulatory Visit (INDEPENDENT_AMBULATORY_CARE_PROVIDER_SITE_OTHER): Payer: PPO | Admitting: Student

## 2021-11-07 ENCOUNTER — Other Ambulatory Visit: Payer: Self-pay

## 2021-11-07 VITALS — BP 147/86 | HR 77 | Temp 97.6°F | Ht 67.0 in | Wt 204.0 lb

## 2021-11-07 DIAGNOSIS — E785 Hyperlipidemia, unspecified: Secondary | ICD-10-CM | POA: Diagnosis not present

## 2021-11-07 DIAGNOSIS — R42 Dizziness and giddiness: Secondary | ICD-10-CM

## 2021-11-07 MED ORDER — SIMVASTATIN 40 MG PO TABS: 40.0000 mg | ORAL_TABLET | Freq: Every evening | ORAL | 2 refills | Status: DC

## 2021-11-07 NOTE — Patient Instructions (Signed)
Ms.Marchella L Bratton, it was a pleasure seeing you today! ? ?Today we discussed: ?- Dizziness: Everything on exam appears re-assuring. I have given you some exercises to do if this happens again. Please make sure to follow-up with Dr. Saverio Danker next week. ? ?- I have started you on a cholesterol medication called simvastatin. Please take this once daily. ? ?I have ordered the following medication/changed the following medications:  ? ?Stop the following medications: ?Medications Discontinued During This Encounter  ?Medication Reason  ? simvastatin (ZOCOR) 40 MG tablet   ? ?  ?Follow-up:  1 week   ? ?Please make sure to arrive 15 minutes prior to your next appointment. If you arrive late, you may be asked to reschedule.  ? ?We look forward to seeing you next time. Please call our clinic at [phone] if you have any questions or concerns. The best time to call is Monday-Friday from 9am-4pm, but there is someone available 24/7. If after hours or the weekend, call the main hospital number and ask for the Internal Medicine Resident On-Call. If you need medication refills, please notify your pharmacy one week in advance and they will send [place] a request. ? ?Thank you for letting us take part in your care. Wishing you the best! ? ?Thank you, ?Sanjuan Dame, MD ? ?

## 2021-11-07 NOTE — Progress Notes (Signed)
? ?  CC: dizziness ? ?HPI: ? ?Miranda Lopez is a 66 y.o. person with medical history as below presenting to [facility] for dizziness ? ?Please see problem-based list for further details, assessments, and plans. ? ?Past Medical History:  ?Diagnosis Date  ? Cancer [facility])   ? cervical cancer  ? Contusion of head, subsequent encounter 11/29/2020  ? COVID-19   ? patient reported 07/2018  ? Diverticulitis   ? last epiosde 10-11 yrs ago  ? Eclampsia   ? Screening for colon cancer 11/29/2020  ? Hx of adenomatous colonic polyps  ? ?Review of Systems:  As per HPI ? ?Physical Exam: ? ?Vitals:  ? 11/07/21 0844  ?BP: (!) 147/86  ?Pulse: 77  ?Temp: 97.6 ?F (36.4 ?C)  ?TempSrc: Oral  ?SpO2: 100%  ?Weight: 204 lb (92.5 kg)  ?Height: '5\' 7"'$  (1.702 m)  ? ?General: Resting comfortably in no acute distress ?HENT: Normocephalic, atraumatic.  ?CV: Regular rate, rhythm. 2/6 systolic murmur appreciated.  ?Pulm: Normal respiratory effort on room air. Clear to ausculation bilaterally. ?Neuro: Awake, alert, conversing appropriately. Cranial nerves in tact. Motor 5/5 throughout. Sensation in tact throughout. Finger to nose normal. Normal gait. ?Psych: Normal mood, affect, speech. ? ?Assessment & Plan:  ? ?Dizziness ?Patient reports an episode of spontaneous dizziness yesterday. She describes sitting at her desk at home working when she suddenly felt like the room was spinning. She denies moving her head around or any other sudden movements prior to this. She subsequently laid down on the bed, and after a few minutes her symptoms subsided. She denies any recent infection, fevers, chills, body aches, dyspnea, lightheadedness, syncope, hearing loss, tinnitus, migraines, focal weakness, numbness, or paresthesias. She does note intermittent palpitations, but denies any around the time of this incident. Reports previous similar episode >20 years ago, but none since. She currently does not feel any symptoms.  ? ?On examination, neurologically in tact  without any focal deficits. Dix-Hallpike negative. Symptoms most consistent with peripheral cause, as this was a singular episode to which symptoms have completely subsided. Discussed with patient no signs of neurologic/structural causes for her symptoms. Patient does have appointment with PCP later this month. We will plan to monitor symptoms for now. Discussed ED precautions. ? ?- Continue to monitor ?- Follow-up with PCP later this month ?- ED precautions given ? ?Hyperlipidemia ?Today discussed recent lab work revealing significantly elevated LDL 150. Patient would like to hold off medication at this time and attempt lifestyle changes beforehand. She mentions she has already started doing this, reporting recent 5lb weight loss. ? ?- Lifestyle modifications ?- Repeat lipid panel 94mo? ?Patient discussed with Dr. MDaryll Drown? ?PSanjuan Dame MD ?Internal Medicine PGY-2 ?Pager: [phone]? ?

## 2021-11-07 NOTE — Progress Notes (Deleted)
lu

## 2021-11-08 DIAGNOSIS — R42 Dizziness and giddiness: Secondary | ICD-10-CM | POA: Insufficient documentation

## 2021-11-08 HISTORY — DX: Dizziness and giddiness: R42

## 2021-11-08 NOTE — Assessment & Plan Note (Addendum)
Patient reports an episode of spontaneous dizziness yesterday. She describes sitting at her desk at home working when she suddenly felt like the room was spinning. She denies moving her head around or any other sudden movements prior to this. She subsequently laid down on the bed, and after a few minutes her symptoms subsided. She denies any recent infection, fevers, chills, body aches, dyspnea, lightheadedness, syncope, hearing loss, tinnitus, migraines, focal weakness, numbness, or paresthesias. She does note intermittent palpitations, but denies any around the time of this incident. Reports previous similar episode >20 years ago, but none since. She currently does not feel any symptoms.  ? ?On examination, neurologically in tact without any focal deficits. Dix-Hallpike negative. Symptoms most consistent with peripheral cause, as this was a singular episode to which symptoms have completely subsided. Discussed with patient no signs of neurologic/structural causes for her symptoms. Patient does have appointment with PCP later this month. We will plan to monitor symptoms for now. Discussed ED precautions. ? ?- Continue to monitor ?- Follow-up with PCP later this month ?- ED precautions given ?

## 2021-11-08 NOTE — Assessment & Plan Note (Signed)
Today discussed recent lab work revealing significantly elevated LDL 150. Patient would like to hold off medication at this time and attempt lifestyle changes beforehand. She mentions she has already started doing this, reporting recent 5lb weight loss. ? ?- Lifestyle modifications ?- Repeat lipid panel 54mo?

## 2021-11-12 ENCOUNTER — Encounter: Payer: Self-pay | Admitting: Dietician

## 2021-11-12 ENCOUNTER — Encounter: Payer: Self-pay | Admitting: Internal Medicine

## 2021-11-12 ENCOUNTER — Other Ambulatory Visit: Payer: Self-pay

## 2021-11-12 ENCOUNTER — Ambulatory Visit (INDEPENDENT_AMBULATORY_CARE_PROVIDER_SITE_OTHER): Payer: PPO | Admitting: Dietician

## 2021-11-12 ENCOUNTER — Other Ambulatory Visit (HOSPITAL_COMMUNITY)
Admission: RE | Admit: 2021-11-12 | Discharge: 2021-11-12 | Disposition: A | Discharge status: Home / Self Care | Payer: PPO | Source: Ambulatory Visit | Attending: Internal Medicine | Admitting: Internal Medicine

## 2021-11-12 ENCOUNTER — Ambulatory Visit (INDEPENDENT_AMBULATORY_CARE_PROVIDER_SITE_OTHER): Payer: PPO | Admitting: Internal Medicine

## 2021-11-12 VITALS — BP 142/82 | HR 73 | Temp 98.0°F | Ht 67.0 in | Wt 204.2 lb

## 2021-11-12 DIAGNOSIS — E669 Obesity, unspecified: Secondary | ICD-10-CM | POA: Diagnosis not present

## 2021-11-12 DIAGNOSIS — N907 Vulvar cyst: Secondary | ICD-10-CM | POA: Diagnosis not present

## 2021-11-12 DIAGNOSIS — R42 Dizziness and giddiness: Secondary | ICD-10-CM

## 2021-11-12 DIAGNOSIS — Z124 Encounter for screening for malignant neoplasm of cervix: Secondary | ICD-10-CM | POA: Diagnosis not present

## 2021-11-12 DIAGNOSIS — Z713 Dietary counseling and surveillance: Secondary | ICD-10-CM

## 2021-11-12 DIAGNOSIS — Z8541 Personal history of malignant neoplasm of cervix uteri: Secondary | ICD-10-CM

## 2021-11-12 DIAGNOSIS — Z1151 Encounter for screening for human papillomavirus (HPV): Secondary | ICD-10-CM | POA: Insufficient documentation

## 2021-11-12 DIAGNOSIS — D4959 Neoplasm of unspecified behavior of other genitourinary organ: Secondary | ICD-10-CM | POA: Diagnosis not present

## 2021-11-12 DIAGNOSIS — R7303 Prediabetes: Secondary | ICD-10-CM

## 2021-11-12 DIAGNOSIS — H814 Vertigo of central origin: Secondary | ICD-10-CM | POA: Diagnosis not present

## 2021-11-12 DIAGNOSIS — Z23 Encounter for immunization: Secondary | ICD-10-CM

## 2021-11-12 DIAGNOSIS — I10 Essential (primary) hypertension: Secondary | ICD-10-CM

## 2021-11-12 DIAGNOSIS — Z01419 Encounter for gynecological examination (general) (routine) without abnormal findings: Secondary | ICD-10-CM | POA: Diagnosis not present

## 2021-11-12 DIAGNOSIS — E785 Hyperlipidemia, unspecified: Secondary | ICD-10-CM

## 2021-11-12 MED ORDER — AMLODIPINE BESYLATE 10 MG PO TABS: 10.0000 mg | ORAL_TABLET | Freq: Every day | ORAL | 11 refills | Status: DC

## 2021-11-12 MED ORDER — SEMAGLUTIDE-WEIGHT MANAGEMENT 0.25 MG/0.5ML ~~LOC~~ SOAJ: 0.2500 mg | SUBCUTANEOUS | 0 refills | Status: DC

## 2021-11-12 NOTE — Assessment & Plan Note (Signed)
Patient is due for tetanus revaccination, recommend Tdap given no chart history of immunization.  Given patient has Medicare I have recommended this be obtained at patient's pharmacy.  She will discuss with pharmacy team. ?

## 2021-11-12 NOTE — Progress Notes (Signed)
Miranda Lopez is very active and is self monitoring her weight weekly. Her diet recall shows she is eating a balanced diet with sources of saturated fat, avoids nuts and seeds due to diverticulosis and stays well hydrated. She has taken measures to increase her vegetable intake. Recommended higher plant intake to increase healthy fats,lower salt and higher fiber to assist with lipids, blood pressure and long-term weight loss.  ?Lab Results  ?Component Value Date  ? HGBA1C 5.9 (H) 10/15/2021  ? HGBA1C 5.7 06/28/2019  ?  ?Wt Readings from Last 10 Encounters:  ?11/12/21 204 lb 3.2 oz (92.6 kg)  ?11/07/21 204 lb (92.5 kg)  ?10/15/21 209 lb (94.8 kg)  ?03/13/21 190 lb (86.2 kg)  ?03/02/21 190 lb (86.2 kg)  ?12/14/20 205 lb 6.4 oz (93.2 kg)  ?11/29/20 203 lb 8 oz (92.3 kg)  ? ?Lipid Panel  ?   ?Component Value Date/Time  ? CHOL 262 (H) 10/15/2021 1213  ? TRIG 145 10/15/2021 1213  ? HDL 85 10/15/2021 1213  ? CHOLHDL 3.1 10/15/2021 1213  ? LDLCALC 152 (H) 10/15/2021 1213  ? LABVLDL 25 10/15/2021 1213  ? ?BP Readings from Last 3 Encounters:  ?11/12/21 (!) 142/82  ?11/07/21 (!) 147/86  ?10/15/21 (!) 158/94  ? ? ?   ? Recommend follow up in 4-6 weeks per patient schedule.  ?Start time:1110 ?End time:1158 ? Total time: 48 minutes ?I reviewed and approve this note and the plan. ?Debera Lat, RD ?11/12/2021 ?4:21 PM. ? ? ?

## 2021-11-12 NOTE — Patient Instructions (Signed)
Thank you, [name] for allowing [place] to provide your care today. We discussed increasing the amlodipine to 10 mg daily. Please continue to monitor your blood pressure and let me know if it is consistently high or low. ? ?Should you have any questions or concerns please call the internal medicine clinic at [phone].   ? ?Charise Killian, MD ?[facility] Internal Medicine ? ?

## 2021-11-12 NOTE — Progress Notes (Signed)
?  Subjective:  ? ?Patient ID: Miranda Lopez female   DOB: 05/28/1956 66 y.o.   MRN: [id number] ? ?HPI: ?[name] is a 66 y.o. person presenting for follow-up of hypertension as well as Pap smear.  Please see problem based charting for full details of today's encounter. ? ?Past Medical History:  ?Diagnosis Date  ? Cancer [facility])   ? cervical cancer  ? Contusion of head, subsequent encounter 11/29/2020  ? COVID-19   ? patient reported 07/2018  ? Diverticulitis   ? last epiosde 10-11 yrs ago  ? Eclampsia   ? Screening for colon cancer 11/29/2020  ? Hx of adenomatous colonic polyps  ? ?Current Outpatient Medications  ?Medication Sig Dispense Refill  ? Semaglutide-Weight Management 0.25 MG/0.5ML SOAJ Inject 0.25 mg into the skin once a week. 2 mL 0  ? amLODipine (NORVASC) 10 MG tablet Take 1 tablet (10 mg total) by mouth daily. 30 tablet 11  ? EPINEPHrine 0.3 mg/0.3 mL IJ SOAJ injection Inject 0.3 mg into the muscle as needed for anaphylaxis. 4 each 3  ? ?No current facility-administered medications for this visit.  ? ?Family History  ?Problem Relation Age of Onset  ? Heart failure Father 73  ? CAD Father 25  ?     Heart attack and quadruple bypass at age 57.  ? Colon cancer Neg Hx   ? Colon polyps Neg Hx   ? Esophageal cancer Neg Hx   ? Rectal cancer Neg Hx   ? Stomach cancer Neg Hx   ? ?Social History  ? ?Socioeconomic History  ? Marital status: Single  ?  Spouse name: Not on file  ? Number of children: Not on file  ? Years of education: Not on file  ? Highest education level: Not on file  ?Occupational History  ? Not on file  ?Tobacco Use  ? Smoking status: Never  ? Smokeless tobacco: Never  ?Substance and Sexual Activity  ? Alcohol use: Yes  ?  Alcohol/week: 7.0 standard drinks  ?  Types: 7 Glasses of wine per week  ?  Comment: wine with dinner nightly  ? Drug use: Not on file  ? Sexual activity: Not on file  ?Other Topics Concern  ? Not on file  ?Social History Narrative  ? Currently working, owns  two businesses.  ? ?Social Determinants of Health  ? ?Financial Resource Strain: Not on file  ?Food Insecurity: Not on file  ?Transportation Needs: Not on file  ?Physical Activity: Not on file  ?Stress: Not on file  ?Social Connections: Not on file  ? ?Objective:  ?Physical Exam: ?Vitals:  ? 11/12/21 1025  ?BP: (!) 142/82  ?Pulse: 73  ?Temp: 98 ?F (36.7 ?C)  ?TempSrc: Oral  ?SpO2: 100%  ?Weight: 204 lb 3.2 oz (92.6 kg)  ?Height: '5\' 7"'$  (1.702 m)  ? ?General appearance: alert, cooperative, appears stated age, and no distress ?Pelvic: cervix normal in appearance, positive findings: mild bleeding of the cervix with brushing, vagina normal without discharge, and 1 cm flesh-colored papule over right labia majora without erythema, TTP, drainage, or ulceration ? ?Assessment & Plan:  ? ?See Encounters tab for problem-based charting. ? ?Charise Killian, MD ? ?

## 2021-11-12 NOTE — Assessment & Plan Note (Addendum)
On external genitalia examination, small 1 cm flesh-colored papule noted over right labia majora.  Patient noted this has been there for many years and has not changed in size and does not cause symptoms of pain or pruritus.  Suspect this represents benign epidermal cyst. Given clinical stability and benign appearance, favor surveillance although could consider referral to gynecology for biopsy if any changes noted.  Advised patient to monitor for change in size or other findings. ?

## 2021-11-12 NOTE — Assessment & Plan Note (Signed)
Continue to discuss evidence of hyperlipidemia seen on recent lipid profile.  Patient had discussed starting statin medication at last visit with Dr. Collene Gobble, however continue to favor lifestyle management at this time.  She has lost about 5 pounds since last visit.  Discussed we could continue to try lifestyle management but would recommend repeat lipids in about 8 weeks and initiation of statin therapy if no significant improvement at that time. ? ?

## 2021-11-12 NOTE — Assessment & Plan Note (Addendum)
Blood pressure remains above goal in clinic today.  Patient brought in home blood pressure cuff with stored readings.  Systolic pressures noted to be above 130-140 majority of the time.  Miranda Lopez noted that her pressures will be in the 150s to 160s while she is working.  Yesterday she did have an episode of palpitations in which her systolic blood pressure was 112, although pressures this low are unusual for her.  Discussed increase in amlodipine to 10 mg daily.  Encouraged Miranda Lopez to watch for signs of dizziness or low pressures with this regimen.  She will continue to check pressures daily and call with any concerns. ?

## 2021-11-12 NOTE — Progress Notes (Signed)
Kolbee Stallman intake 3/20:  ? ?Pt has a goal of losing 25 lbs overall. She reports being agreeable to Ozempic and losing 5 lbs. Dietitian discussed options for increasing fiber such as psyllium husk, whole grains, fruit/veg, nuts/seeds, and beans. ? ?Breakfast: ?- 1 poached egg, 1 tbs butter, 2 pieces regular bacon, 1 piece toast ?-Raisin bran, 1 cup 2% milk ? ?Snack:  ?-2 crackers ?-homemade fries (cooked in deep fryer) ? ?Lunch: ?- leftovers, pears, banana, apples (with peanut butter) ?-1/2 piece chicken ? ?Supper:  ?-Salmon (2-3 oz), chicken (2-3 oz), 1/2 plate vegetables (broccoli, asparagus, green beans, artichoke, corn)  ?-1/3 white potato, sour cream, butter, salt ?-Spaghetti and meatballs ? ?Drinks:  ?Homemade peach tea, sugar-free ?1 gallon water per day ?4 cups coffee per day (with fat-free half & half) ?1 finger of scotch at bedtime ?1 glass red wine ? ?Physical activity:  ?-Gym 2x per week ?-Golf + walk 9 holes 2x per week ?-Walk 3 miles/day ? ?Lucienne Minks ?11/12/2021 ?3:52 PM ?

## 2021-11-12 NOTE — Assessment & Plan Note (Signed)
Hemoglobin A1c 5.9% at last check.  Continues to be consistent with prediabetes.  We were able to initiate weekly semaglutide at last visit, however patient was notified by insurance that she will need to complete stepup therapy to continue taking this medication.  We discussed these options today the patient does not wish to trial other medications at this time.  We will continue lifestyle modification, meeting with our nutritionist in clinic today, and consider medication at future visit if A1c is worsening. ?

## 2021-11-12 NOTE — Progress Notes (Signed)
Internal Medicine Clinic Attending ? ?Case discussed with Dr. Braswell  at the time of the visit.  We reviewed the resident?s history and exam and pertinent patient test results.  I agree with the assessment, diagnosis, and plan of care documented in the resident?s note.  ?

## 2021-11-12 NOTE — Patient Instructions (Addendum)
Thank you for your visit today! ? ?For weight loss- it is important to  ?1- Track your progress- food intake, weight ?2- Eat  ?     A- low fat- especially fats from plants  ?     B- high fiber- at least 25 grams a day but increasing it even higher may be beneficial, chia seeds, psyllium fiber ?    C- whole "food" lots of vegetables and fruits ?3- Stay active as you have been ? ?Goals to add fibers- eating more kale and chia seeds, maybe psyllium husk.  ? Butch Penny ?[phone] ? ? ?

## 2021-11-12 NOTE — Assessment & Plan Note (Signed)
Weight down from 209 to 204 pounds today.  Tolerating 0.25 mg of semaglutide weekly, started for prediabetes with benefit of weight loss.  Miranda Lopez was notified this medication will not be covered at this time for prediabetes, will attempt to send in next dose of semaglutide for weight management alone.  Continuing to discuss lifestyle management with physical activity and nutrition, meeting with nutritionist Butch Penny after today's visit. ?

## 2021-11-12 NOTE — Assessment & Plan Note (Addendum)
Episode of spontaneous vertiginous symptoms occurred last week, 11/07/2021.  Patient was sitting at her desk at home working when she suddenly felt the room was spinning, associated with nausea and difficulty walking to her bed.  After lying down for several minutes symptoms resolved.  She remembers a similar episode about 20 years ago but has not noted anything since that time.  She has not noticed vertiginous symptoms with specific head movements and does not remember moving her head a specific direction prior to the episode last week.  She was seen in clinic the following day 11/08/2021 for evaluation and noted to have normal neurologic exam, negative orthostatics, and negative Dix-Hallpike.  Today we discussed possible etiologies including orthostatic hypotension, inner ear pathology such as M?ni?re's disease or BPPV, neurologic cause such as TIA, or cardiac etiology.  Given exam findings last week orthostatic hypotension and inner ear pathology seems less likely at this time.  No recent hearing loss or tinnitus reported by patient.  Patient has had intermittent palpitations which have been described previously and is scheduled for follow-up with cardiology in the next 2 weeks, however symptoms do not sound consistent with cardiac cause.  Given spontaneous onset of vertigo with complete resolution, considered TIA and/or posterior circulation disease.  Patient does have risk factors of hyperlipidemia, hypertension, obesity.  Discussed risk factor management versus imaging versus both today and patient wishes to pursue both at this time.  Discussed with radiology and will plan to obtain MRI brain, MRA head and neck to evaluate for vascular disease and/or ischemia.  Advised to call or present to ED with further episodes, especially if not resolving. ?

## 2021-11-12 NOTE — Assessment & Plan Note (Signed)
Patient reports history of cervical cancer, although notes it may have been cervical dysplasia.  She remembers receiving cryoablation in 1983 for abnormal findings, followed by 2 procedures with removal of cervical tissue over subsequent years.  As mentioned previously she does not have records for review.  She believes last Pap smear was 3 years ago.  On exam the cervix appeared normal with only mild bleeding with brushing.  Discussed cotesting and follow-up for results, may require referral to gynecology given complex history. ?

## 2021-11-13 LAB — CYTOLOGY - PAP
Comment: NEGATIVE
Diagnosis: NEGATIVE
High risk HPV: NEGATIVE

## 2021-11-15 NOTE — Progress Notes (Signed)
Normal cervical cancer co-testing. Called to discuss this with the patient, left voicemail as Miranda Lopez was not available. Given unclear history of cervical neoplasm and inability to obtain prior records, will plan for annual exam/co-test. I will discuss this with the patient at next visit.

## 2021-11-27 NOTE — Progress Notes (Signed)
CARDIOLOGY CONSULT NOTE  ? ? ? ? ? ?Patient ID: ?Miranda Lopez ?MRN: [id number] ?DOB/AGE: [birth year] 68 y.o. ? ?Admit date: (Not on file) ?Referring Physician: Saverio Danker ?Primary Physician: Charise Killian, MD ?Primary Cardiologist: New ?Reason for Consultation: Palpitations / Murmur  ? ?Active Problems: ?  * No active hospital problems. * ? ? ?HPI:  66 y.o. referred by Dr Saverio Danker for palpitations and murmur She has a history of HTN as well as cervical cancer She is on Semaglutide for weight loss She drinks wine with dinner nightly March had some vertigo Intermittent palpitations not related to dizziness and no long periods of sudden tachycardia Norvasc dose increased to 10 mg for elevated home readings  ? ?Her TC is 262 with LDL 152 she has not wanted to start meds and trying to lose weight  ? ?TTE done 11/02/21 EF 65-70% posterior leaflet prolapse with mild MR and aortic root 3.9 cm ? ?Never told of heart murmur. She has good teeth and sees dentist regularly   ? ?Her palpitations are rare and benign sounding Discussed increased risk of PAF with HTN, MR and LAE.  ? ?No chest pain , dyspnea , PND, orthopnea/syncope  ? 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 ?ROS ?All other systems reviewed and negative except as noted above ? ?Past Medical History:  ?Diagnosis Date  ? Cancer [facility])   ? cervical cancer  ? Contusion of head, subsequent encounter 11/29/2020  ? COVID-19   ? patient reported 07/2018  ? Diverticulitis   ? last epiosde 10-11 yrs ago  ? Eclampsia   ? Screening for colon cancer 11/29/2020  ? Hx of adenomatous colonic polyps  ?  ?Family History  ?Problem Relation Age of Onset  ? Heart failure Father 2  ? CAD Father 52  ?     Heart attack and quadruple bypass at age 55.  ? Colon cancer Neg Hx   ? Colon polyps Neg Hx   ? Esophageal cancer Neg Hx   ? Rectal cancer Neg Hx   ? Stomach cancer Neg Hx   ?   ?Social History  ? ?Socioeconomic History  ? Marital status: Single  ?  Spouse name: Not on file  ? Number of children: Not on file  ? Years of education: Not on file  ? Highest education level: Not on file  ?Occupational History  ? Not on file  ?Tobacco Use  ? Smoking status: Never  ? Smokeless tobacco: Never  ?Substance and Sexual Activity  ? Alcohol use: Yes  ?  Alcohol/week: 7.0 standard drinks  ?  Types: 7 Glasses of wine per week  ?  Comment: wine with dinner nightly  ? Drug use: Not on file  ? Sexual activity: Not on file  ?Other Topics Concern  ? Not on file  ?Social History Narrative  ? Currently working, owns two businesses.  ? ?Social Determinants of Health  ? ?Financial Resource Strain: Not on file  ?Food Insecurity: Not on file  ?Transportation Needs: Not on file  ?Physical Activity: Not on file  ?Stress: Not on file  ?Social Connections: Not on file  ?Intimate Partner Violence: Not on file  ?  ?Past Surgical History:  ?Procedure Laterality Date  ? cervix removed for cervical cancer    ? CESAREAN SECTION    ? x2  ?  COLONOSCOPY    ? HERNIA REPAIR    ? age 18  ? POLYPECTOMY    ?  ? ? ?Current Outpatient Medications:  ?  amLODipine (NORVASC) 10 MG tablet, Take 1 tablet (10 mg total) by mouth daily., Disp: 30 tablet, Rfl: 11 ?  EPINEPHrine 0.3 mg/0.3 mL IJ SOAJ injection, Inject 0.3 mg into the muscle as needed for anaphylaxis., Disp: 4 each, Rfl: 3 ?  Liraglutide -Weight Management 18 MG/3ML SOPN, Inject 0.6 mg into the skin daily for 7 days, THEN 1.2 mg daily for 7 days, THEN 1.8 mg daily for 7 days, THEN 2.4 mg daily for 7 days, THEN 3 mg daily for 7 days., Disp: 10.5 mL, Rfl: 0 ? ? ? ?Physical Exam: ?Blood pressure 136/74, pulse 81, height '5\' 7"'$  (1.702 m), weight 200 lb (90.7 kg), SpO2 99 %.   ? ?Affect appropriate ?Healthy:  appears stated age ?HEENT: normal ?Neck supple with no adenopathy ?JVP normal no bruits no thyromegaly ?Lungs clear with no wheezing and good diaphragmatic motion ?Heart:  S1/S2 no  murmur, no rub, gallop or click ?PMI normal ?Abdomen: benighn, BS positve, no tenderness, no AAA ?no bruit.  No HSM or HJR ?Distal pulses intact with no bruits ?No edema ?Neuro non-focal ?Skin warm and dry ?No muscular weakness ? ? ?Labs: ?  ?Lab Results  ?Component Value Date  ? WBC 6.3 11/29/2020  ? HGB 14.1 11/29/2020  ? HCT 43.4 11/29/2020  ? MCV 95 11/29/2020  ? PLT 239 11/29/2020  ? No results for input(s): NA, K, CL, CO2, BUN, CREATININE, CALCIUM, PROT, BILITOT, ALKPHOS, ALT, AST, GLUCOSE in the last 168 hours. ? ?Invalid input(s): LABALBU ?No results found for: CKTOTAL, CKMB, CKMBINDEX, TROPONINI  ?Lab Results  ?Component Value Date  ? CHOL 262 (H) 10/15/2021  ? ?Lab Results  ?Component Value Date  ? HDL 85 10/15/2021  ? ?Lab Results  ?Component Value Date  ? LDLCALC 152 (H) 10/15/2021  ? ?Lab Results  ?Component Value Date  ? TRIG 145 10/15/2021  ? ?Lab Results  ?Component Value Date  ? CHOLHDL 3.1 10/15/2021  ? ?No results found for: LDLDIRECT  ?  ?Radiology: ?ECHOCARDIOGRAM COMPLETE ? ?Result Date: 11/02/2021 ?   ECHOCARDIOGRAM REPORT   Patient Name:   Miranda Lopez Date of Exam: 11/02/2021 Medical Rec #:  [id number]            Height:       67.0 in Accession #:    [id number]           Weight:       209.0 lb Date of Birth:  November 11, 1955             BSA:          2.060 m? Patient Age:    71 years             BP:           159/94 mmHg Patient Gender: F                    HR:           70 bpm. Exam Location:  Outpatient Procedure: 2D Echo Indications:    Murmur  History:        Patient has no prior history of Echocardiogram examinations.                 Risk Factors:Hypertension.  Sonographer:    Jefferey Pica Referring Phys: [id number] GRACE  LAU IMPRESSIONS  1. Left ventricular ejection fraction, by estimation, is 65 to 70%. The left ventricle has normal function. The left ventricle has no regional wall motion abnormalities. There is mild left ventricular hypertrophy. Left ventricular diastolic parameters  are indeterminate.  2. Right ventricular systolic function is normal. The right ventricular size is normal. Tricuspid regurgitation signal is inadequate for assessing PA pressure.  3. The mitral valve is abnormal. Posterior leaflet prolapse. Mild mitral valve regurgitation. No evidence of mitral stenosis.  4. The aortic valve is tricuspid. Aortic valve regurgitation is not visualized. No aortic stenosis is present.  5. Aortic dilatation noted. There is mild dilatation of the ascending aorta, measuring 39 mm.  6. The inferior vena cava is dilated in size with >50% respiratory variability, suggesting right atrial pressure of 8 mmHg. FINDINGS  Left Ventricle: Left ventricular ejection fraction, by estimation, is 65 to 70%. The left ventricle has normal function. The left ventricle has no regional wall motion abnormalities. The left ventricular internal cavity size was normal in size. There is  mild left ventricular hypertrophy. Left ventricular diastolic parameters are indeterminate. Right Ventricle: The right ventricular size is normal. No increase in right ventricular wall thickness. Right ventricular systolic function is normal. Tricuspid regurgitation signal is inadequate for assessing PA pressure. Left Atrium: Left atrial size was normal in size. Right Atrium: Right atrial size was normal in size. Pericardium: There is no evidence of pericardial effusion. Mitral Valve: The mitral valve is abnormal. Mild mitral valve regurgitation. No evidence of mitral valve stenosis. Tricuspid Valve: The tricuspid valve is normal in structure. Tricuspid valve regurgitation is trivial. Aortic Valve: The aortic valve is tricuspid. Aortic valve regurgitation is not visualized. No aortic stenosis is present. Aortic valve peak gradient measures 8.4 mmHg. Pulmonic Valve: The pulmonic valve was not well visualized. Pulmonic valve regurgitation is not visualized. Aorta: The aortic root is normal in size and structure and aortic  dilatation noted. There is mild dilatation of the ascending aorta, measuring 39 mm. Venous: The inferior vena cava is dilated in size with greater than 50% respiratory variability, suggesting right atrial press

## 2021-11-29 ENCOUNTER — Other Ambulatory Visit: Payer: Self-pay | Admitting: Internal Medicine

## 2021-11-29 MED ORDER — LIRAGLUTIDE -WEIGHT MANAGEMENT 18 MG/3ML ~~LOC~~ SOPN: PEN_INJECTOR | SUBCUTANEOUS | 0 refills | Status: DC

## 2021-11-30 ENCOUNTER — Encounter: Payer: Self-pay | Admitting: Cardiovascular Disease

## 2021-11-30 ENCOUNTER — Ambulatory Visit: Payer: PPO | Admitting: Cardiovascular Disease

## 2021-11-30 VITALS — BP 136/74 | HR 81 | Ht 67.0 in | Wt 200.0 lb

## 2021-11-30 DIAGNOSIS — R011 Cardiac murmur, unspecified: Secondary | ICD-10-CM

## 2021-11-30 DIAGNOSIS — R002 Palpitations: Secondary | ICD-10-CM

## 2021-11-30 DIAGNOSIS — I341 Nonrheumatic mitral (valve) prolapse: Secondary | ICD-10-CM

## 2021-11-30 NOTE — Patient Instructions (Addendum)
Medication Instructions:  ?Your physician recommends that you continue on your current medications as directed. Please refer to the Current Medication list given to you today. ? ?*If you need a refill on your cardiac medications before your next appointment, please call your pharmacy* ? ?Lab Work: ?If you have labs (blood work) drawn today and your tests are completely normal, you will receive your results only by: ?MyChart Message (if you have MyChart) OR ?A paper copy in the mail ?If you have any lab test that is abnormal or we need to change your treatment, we will call you to review the results. ? ?Testing/Procedures: ?Cardiac CT scanning for calcium score, (CAT scanning), is a noninvasive, special x-ray that produces cross-sectional images of the body using x-rays and a computer. CT scans help physicians diagnose and treat medical conditions. For some CT exams, a contrast material is used to enhance visibility in the area of the body being studied. CT scans provide greater clarity and reveal more details than regular x-ray exams. ? ?Your physician has requested that you have an echocardiogram in 1 year with office visit. Echocardiography is a painless test that uses sound waves to create images of your heart. It provides your doctor with information about the size and shape of your heart and how well your heart?s chambers and valves are working. This procedure takes approximately one hour. There are no restrictions for this procedure. ? ?Follow-Up: ?At [facility], you and your health needs are our priority.  As part of our continuing mission to provide you with exceptional heart care, we have created designated Provider Care Teams.  These Care Teams include your primary Cardiologist (physician) and Advanced Practice Providers (APPs -  Physician Assistants and Nurse Practitioners) who all work together to provide you with the care you need, when you need it. ? ?We recommend signing up for the patient portal  called "MyChart".  Sign up information is provided on this After Visit Summary.  MyChart is used to connect with patients for Virtual Visits (Telemedicine).  Patients are able to view lab/test results, encounter notes, upcoming appointments, etc.  Non-urgent messages can be sent to your provider as well.   ?To learn more about what you can do with MyChart, go to [email or web address].   ? ?Your next appointment:   ?1 year  ? ?The format for your next appointment:   ?In Person ? ?Provider:   ?Jenkins Rouge, MD { ? ? ?

## 2021-12-01 DIAGNOSIS — H40033 Anatomical narrow angle, bilateral: Secondary | ICD-10-CM | POA: Diagnosis not present

## 2021-12-01 DIAGNOSIS — H2513 Age-related nuclear cataract, bilateral: Secondary | ICD-10-CM | POA: Diagnosis not present

## 2021-12-05 ENCOUNTER — Ambulatory Visit (HOSPITAL_COMMUNITY)
Admission: RE | Admit: 2021-12-05 | Discharge: 2021-12-05 | Disposition: A | Discharge status: Home / Self Care | Payer: PPO | Source: Ambulatory Visit | Attending: Internal Medicine | Admitting: Internal Medicine

## 2021-12-05 ENCOUNTER — Ambulatory Visit: Payer: PPO

## 2021-12-05 DIAGNOSIS — R42 Dizziness and giddiness: Secondary | ICD-10-CM | POA: Diagnosis not present

## 2021-12-05 DIAGNOSIS — H814 Vertigo of central origin: Secondary | ICD-10-CM | POA: Diagnosis not present

## 2021-12-05 DIAGNOSIS — I6782 Cerebral ischemia: Secondary | ICD-10-CM | POA: Insufficient documentation

## 2021-12-05 DIAGNOSIS — D320 Benign neoplasm of cerebral meninges: Secondary | ICD-10-CM | POA: Insufficient documentation

## 2021-12-05 IMAGING — MR MR HEAD WO/W CM
13 of 15 series · 40 of 48 positions shown · IV contrast (gadavist)
Comparison: 9.5 mL Gadavist intravenous contrast.

CLINICAL DATA: Provided history: Vertigo. Vertigo of central
origin. Rule out vertebrobasilar disease.

EXAM:
MRI HEAD WITHOUT AND WITH CONTRAST
MRA HEAD WITHOUT CONTRAST
MRA NECK WITHOUT AND WITH CONTRAST
TECHNIQUE: Multiplanar, multi-echo pulse sequences of the brain and surrounding
structures were acquired without and with intravenous contrast.
Angiographic images of the Circle of Willis were acquired using MRA
technique without intravenous contrast. Angiographic images of the
neck were acquired using MRA technique without and with intravenous
contrast. Carotid stenosis measurements (when applicable) are
obtained utilizing NASCET criteria, using the distal internal
carotid diameter as the denominator.
CONTRAST:  9.5mL GADAVIST GADOBUTROL 1 MMOL/ML IV SOLN

[Series 5: DWI · axial · 3.0mm · 0.88mm/px · z∈[-57,+88]mm · 6 of 100 slices shown (1 of 4)]
[im 1/100]
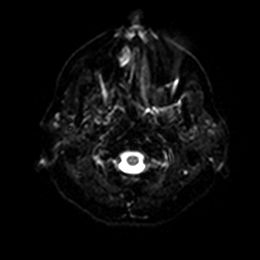
[im 20/100]
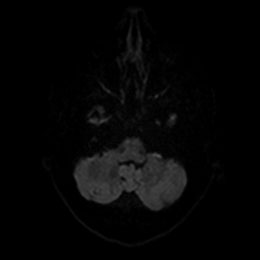
[im 40/100]
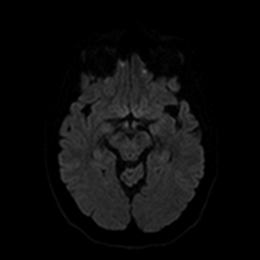
[im 60/100]
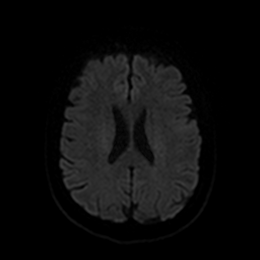
[im 80/100]
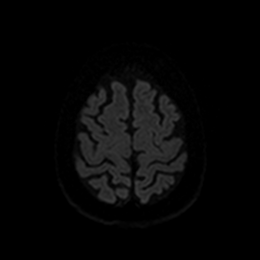
[im 100/100]
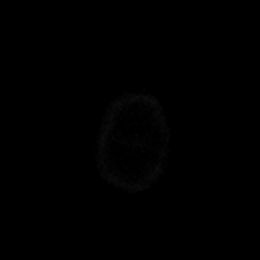

[Series 6: DWI · axial · 3.0mm · 0.88mm/px · z∈[-57,+88]mm · 3 of 50 slices shown (2 of 4)]
[im 1/50]
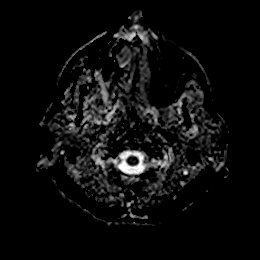
[im 25/50]
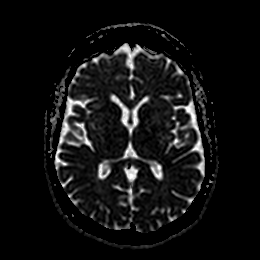
[im 50/50]
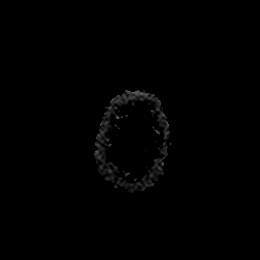

[Series 7: DWI · coronal · 4.0mm · 0.88mm/px · 4 of 68 slices shown (3 of 4)]
[im 1/68]
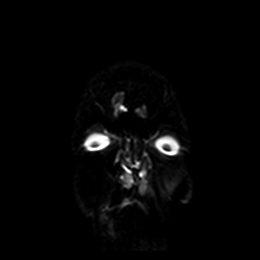
[im 23/68]
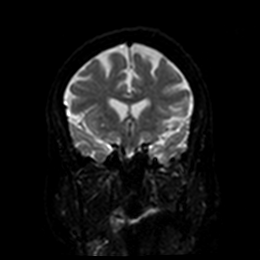
[im 45/68]
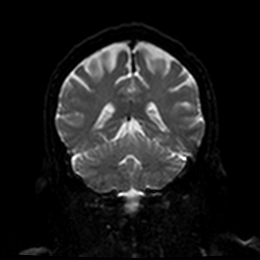
[im 68/68]
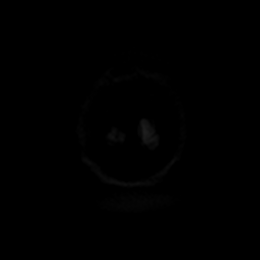

[Series 8: DWI · coronal · 4.0mm · 0.88mm/px · 2 of 34 slices shown (4 of 4)]
[im 1/34]
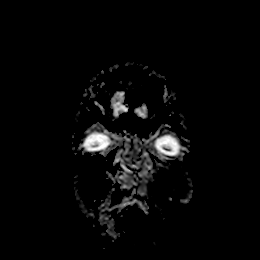
[im 34/34]
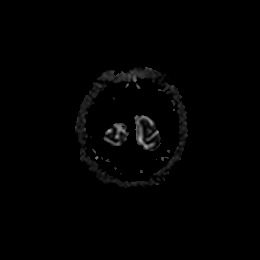

[Series 9: T1 · sagittal · 5.0mm · 0.75mm/px · 2 of 25 slices shown]
[im 1/25]
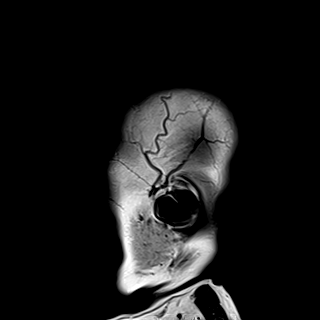
[im 25/25]
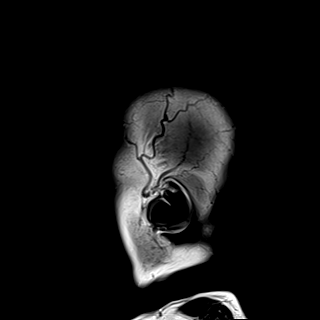

[Series 10: T2 · axial · 5.0mm · 0.72mm/px · z∈[-56,+86]mm · 2 of 25 slices shown]
[im 1/25]
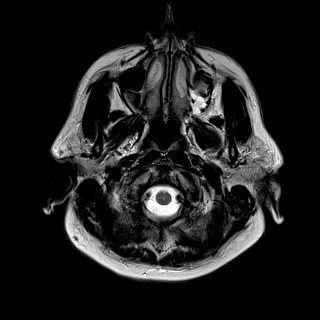
[im 25/25]
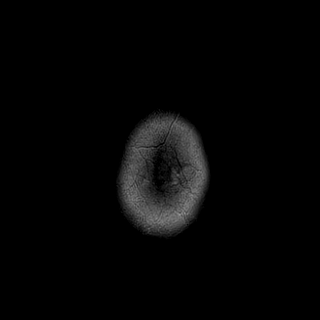

[Series 11: FLAIR · axial · 5.0mm · 0.45mm/px · z∈[-57,+85]mm · 2 of 25 slices shown]
[im 1/25]
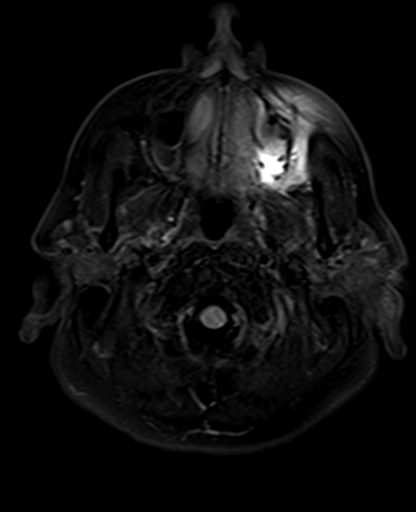
[im 25/25]
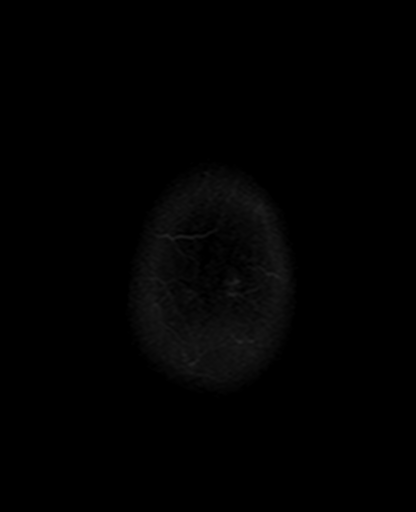

[Series 12: mag_images · axial · 3.0mm · 0.90mm/px · z∈[-81,+94]mm · 4 of 60 slices shown]
[im 1/60]
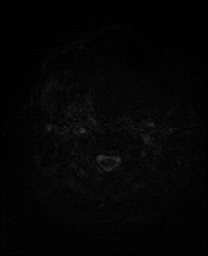
[im 20/60]
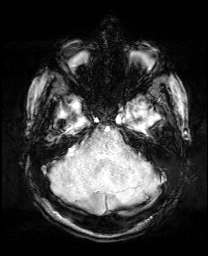
[im 40/60]
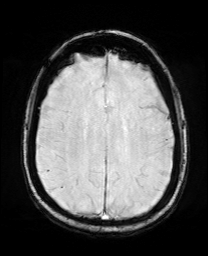
[im 60/60]
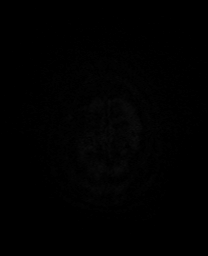

[Series 13: pha_images · axial · 3.0mm · 0.90mm/px · z∈[-78,+91]mm · 4 of 58 slices shown]
[im 1/58]
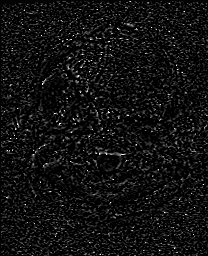
[im 20/58]
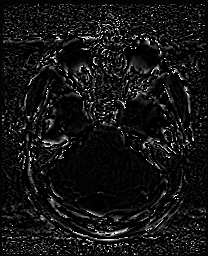
[im 39/58]
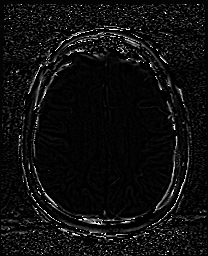
[im 58/58]
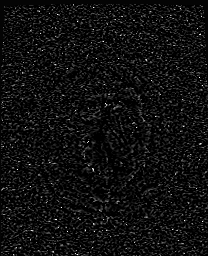

[Series 14: swi_images · axial · 3.0mm · 0.90mm/px · z∈[-81,+94]mm · 4 of 60 slices shown]
[im 1/60]
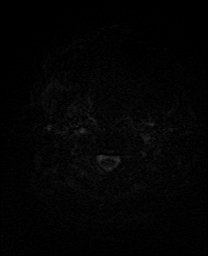
[im 20/60]
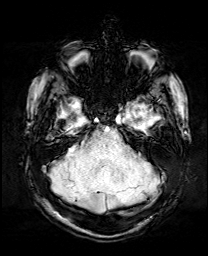
[im 40/60]
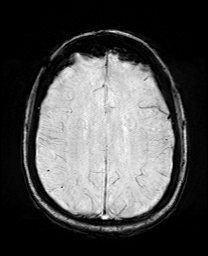
[im 60/60]
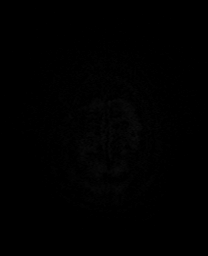

[Series 15: mip_images(sw) · axial · 24.0mm · 0.90mm/px · z∈[-71,+83]mm · 3 of 53 slices shown]
[im 1/53]
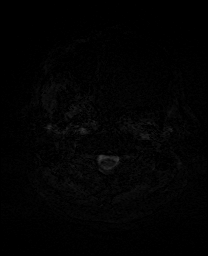
[im 27/53]
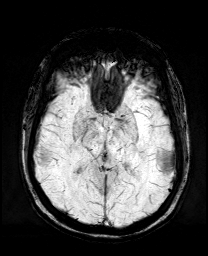
[im 53/53]
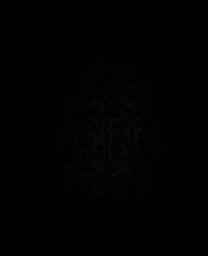

[Series 17: T2 post-contrast · coronal · 5.0mm · 0.72mm/px · 2 of 31 slices shown]
[im 1/31]
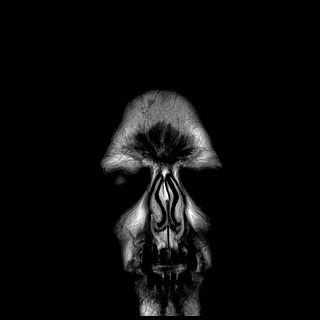
[im 31/31]
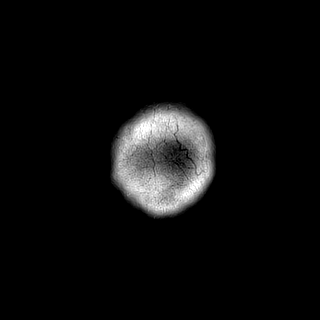

[Series 19: T1 post-contrast · coronal · 5.0mm · 0.34mm/px · 2 of 31 slices shown]
[im 1/31]
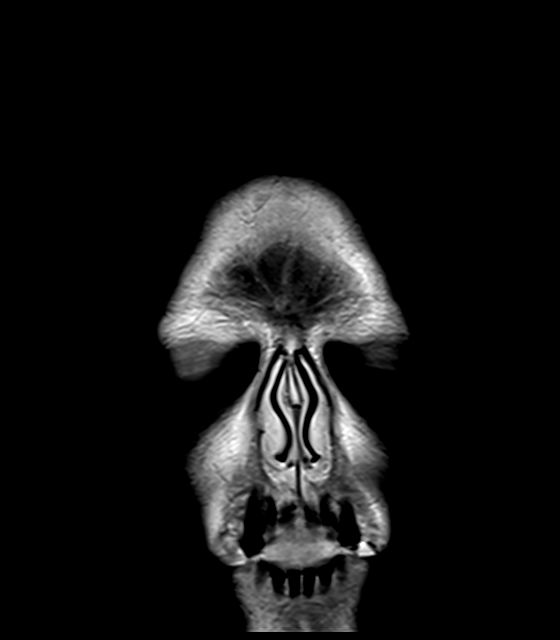
[im 31/31]
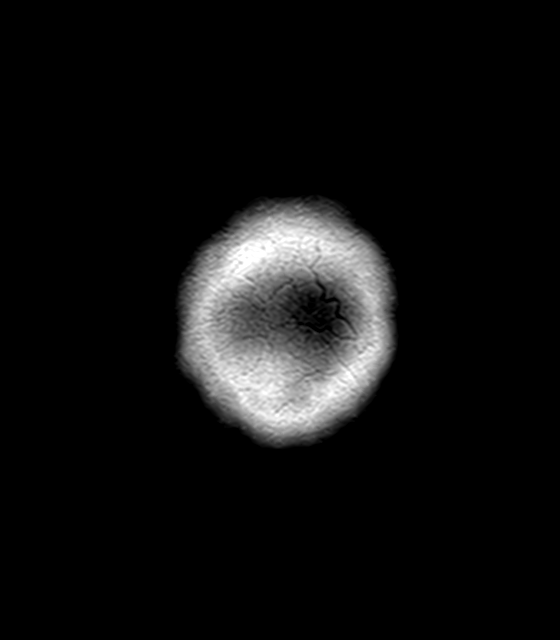

[40 of 48 positions shown; findings below may reference images not displayed]

FINDINGS: MRI HEAD FINDINGS

Brain:

No age advanced or lobar predominant parenchymal atrophy.

Mild multifocal T2 FLAIR hyperintense signal abnormality within the
cerebral white matter, nonspecific but compatible with chronic small
vessel ischemic disease.

6 mm enhancing dural-based mass overlying the posterior left frontal
lobe compatible with small meningioma (series 18, image 55) (series
19, image 15).

Retrocerebellar CSF intensity prominence, measuring 1.9 cm in AP
dimension. This may reflect a mega cisterna magna or less likely a
posterior fossa arachnoid cyst.

There is no acute infarct.

No chronic intracranial blood products.

No midline shift.

Vascular: Maintained flow voids within the proximal large arterial
vessels.

Skull and upper cervical spine: No focal suspicious marrow lesion.
Incompletely assessed cervical spondylosis.

Sinuses/Orbits: Visualized orbits show no acute finding. Small
mucous retention cyst within the left maxillary sinus. Trace mucosal
thickening within the bilateral ethmoid sinuses.

Other: 14 mm T2 hypointense lesion within the left parietal scalp,
nonspecific but likely reflecting a sebaceous cyst.

MRA HEAD FINDINGS

Anterior circulation:

The intracranial internal carotid arteries are patent. The M1 middle
cerebral arteries are patent. No M2 proximal branch occlusion or
high-grade proximal stenosis is identified. The anterior cerebral
arteries are patent. No intracranial aneurysm is identified.

Posterior circulation:

The intracranial vertebral arteries are patent. The basilar artery
is patent. The posterior cerebral arteries are patent. Fetal origin
right posterior cerebral artery. The left posterior communicating
artery is diminutive or absent.

Anatomic variants: As described.

MRA NECK FINDINGS

Aortic arch: Common origin of the innominate and left common carotid
arteries. No hemodynamically significant innominate or proximal
subclavian artery stenosis.

Right carotid system: CCA and ICA patent within the neck without
stenosis. Tortuosity of the cervical ICA.

Left carotid system: CCA and ICA patent within the neck without
stenosis. Tortuosity of the CCA.

Vertebral arteries: Vertebral arteries codominant and patent within
the neck without stenosis.
IMPRESSION: MRI brain:

1. Mild chronic small vessel ischemic changes within the cerebral
white matter.
2. 6 mm meningioma overlying the posterior left frontal lobe.
3. Mega cisterna magna versus incidental posterior fossa arachnoid
cyst.
4. Mild paranasal sinus disease, as described.

MRA head:

1. No intracranial large vessel occlusion or proximal high-grade
arterial stenosis.
2. Anatomic variation, as described.

MRA neck:

The common carotid, internal carotid and vertebral arteries are
patent within the neck without stenosis.

## 2021-12-05 IMAGING — MR MR MRA HEAD W/O CM
1 series · 17 of 48 positions shown · IV contrast (gadavist)
Comparison: 9.5 mL Gadavist intravenous contrast.

CLINICAL DATA: Provided history: Vertigo. Vertigo of central
origin. Rule out vertebrobasilar disease.

EXAM:
MRI HEAD WITHOUT AND WITH CONTRAST
MRA HEAD WITHOUT CONTRAST
MRA NECK WITHOUT AND WITH CONTRAST
TECHNIQUE: Multiplanar, multi-echo pulse sequences of the brain and surrounding
structures were acquired without and with intravenous contrast.
Angiographic images of the Circle of Willis were acquired using MRA
technique without intravenous contrast. Angiographic images of the
neck were acquired using MRA technique without and with intravenous
contrast. Carotid stenosis measurements (when applicable) are
obtained utilizing NASCET criteria, using the distal internal
carotid diameter as the denominator.
CONTRAST:  9.5mL GADAVIST GADOBUTROL 1 MMOL/ML IV SOLN

[Series 5: 3d cow · axial · 0.5mm · 0.41mm/px · z∈[-72,+31]mm · 17 of 224 slices shown]
[im 1/224]
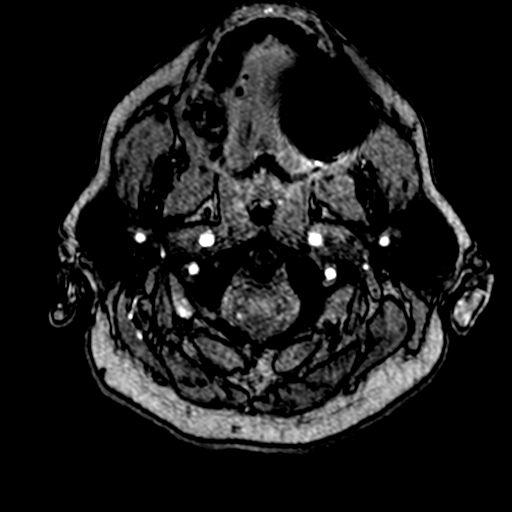
[im 5/224]
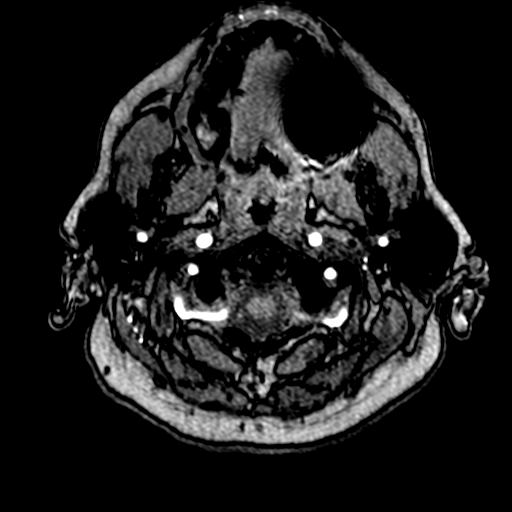
[im 10/224]
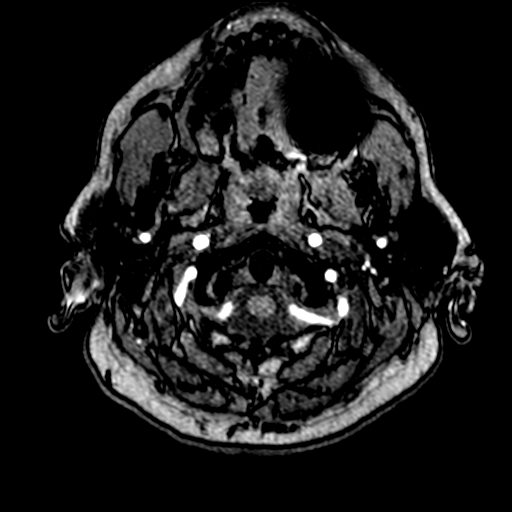
[im 15/224]
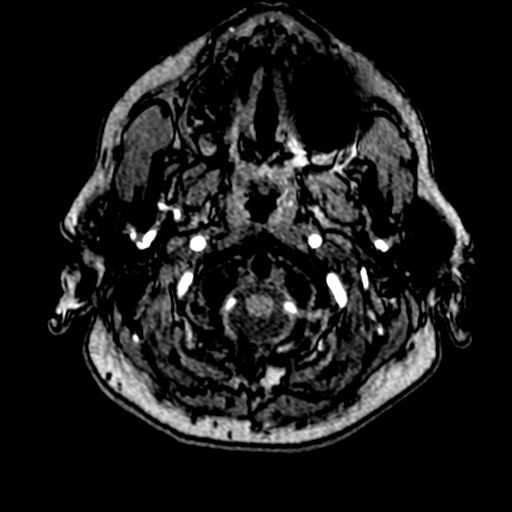
[im 19/224]
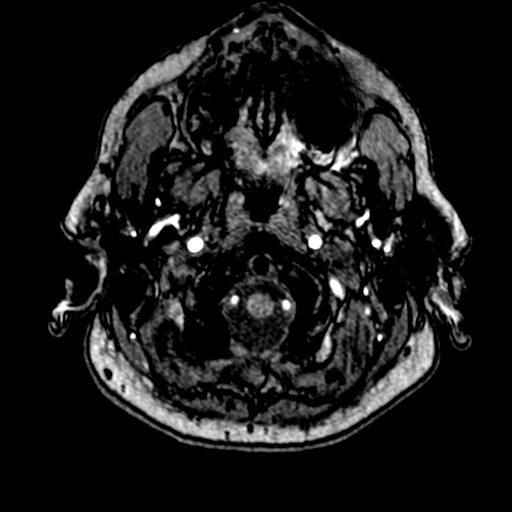
[im 24/224]
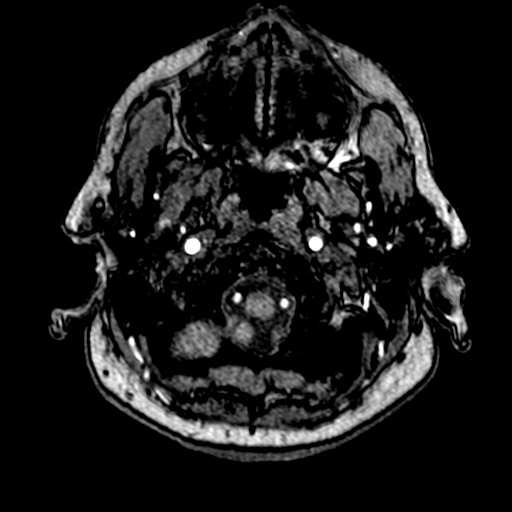
[im 29/224]
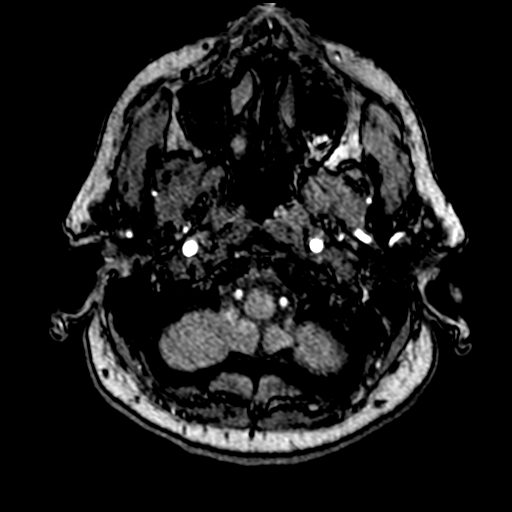
[im 38/224]
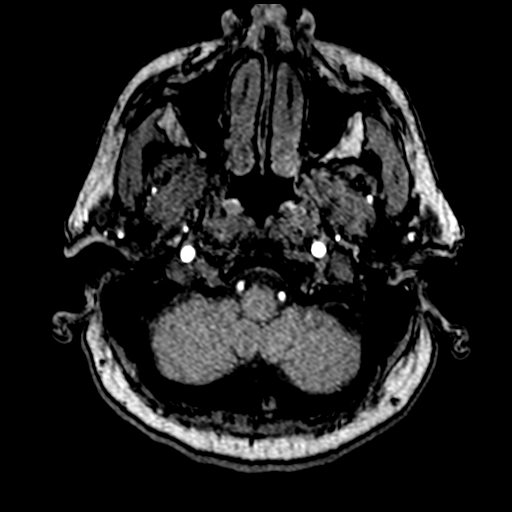
[im 43/224]
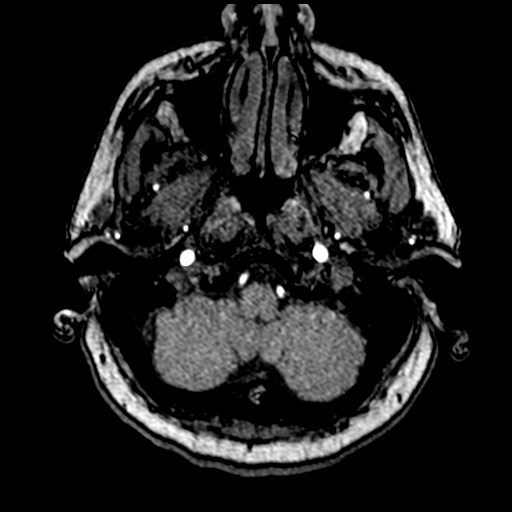
[im 72/224]
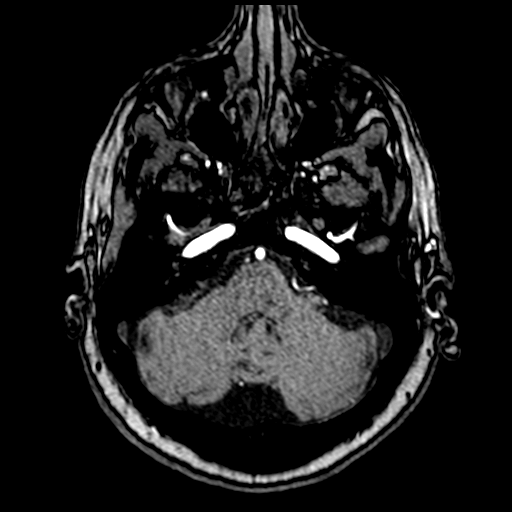
[im 100/224]
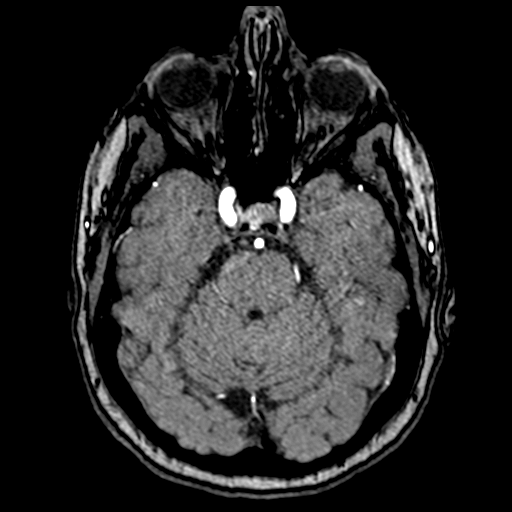
[im 114/224]
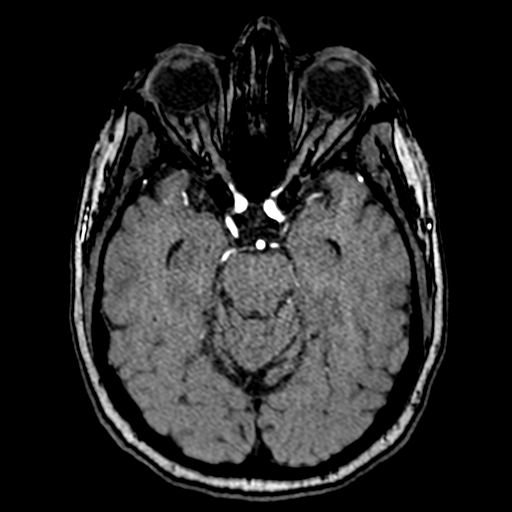
[im 129/224]
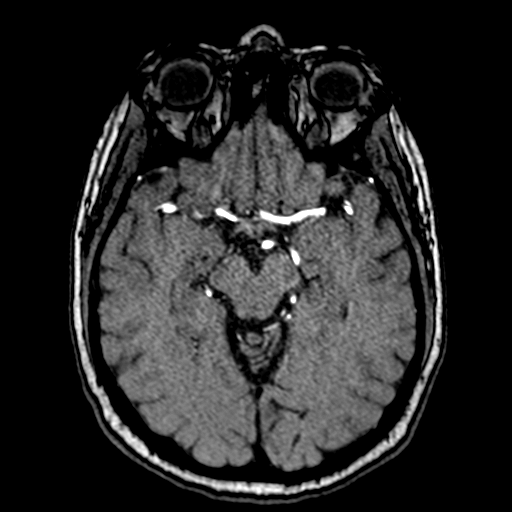
[im 157/224]
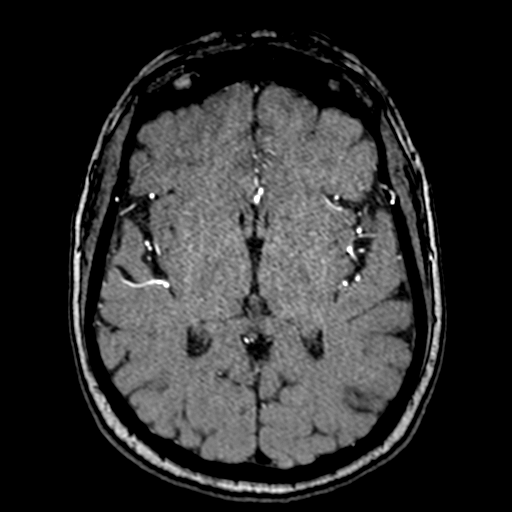
[im 186/224]
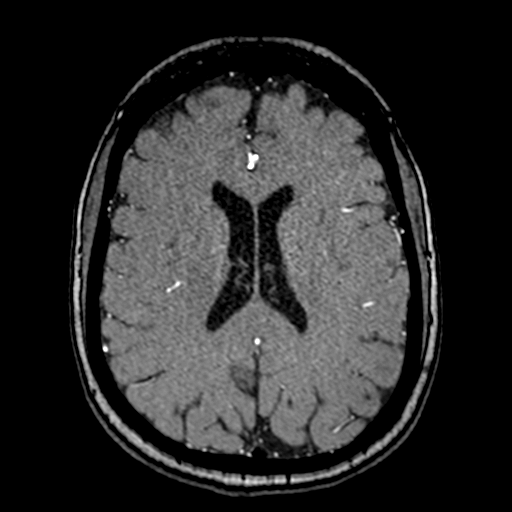
[im 190/224]
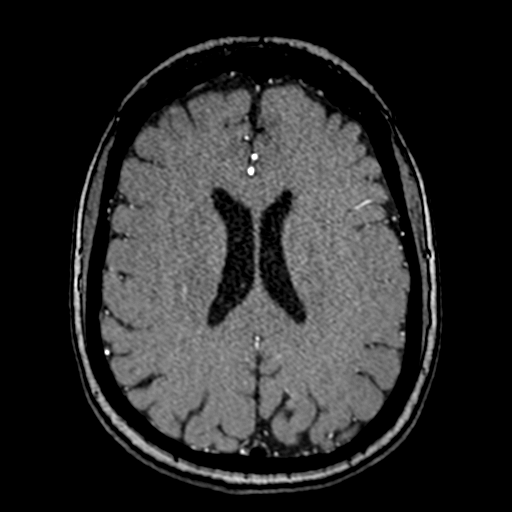
[im 214/224]
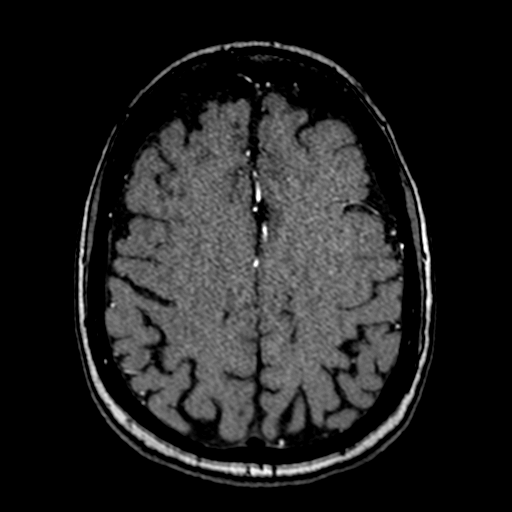

[17 of 48 positions shown; findings below may reference images not displayed]

FINDINGS: MRI HEAD FINDINGS

Brain:

No age advanced or lobar predominant parenchymal atrophy.

Mild multifocal T2 FLAIR hyperintense signal abnormality within the
cerebral white matter, nonspecific but compatible with chronic small
vessel ischemic disease.

6 mm enhancing dural-based mass overlying the posterior left frontal
lobe compatible with small meningioma (series 18, image 55) (series
19, image 15).

Retrocerebellar CSF intensity prominence, measuring 1.9 cm in AP
dimension. This may reflect a mega cisterna magna or less likely a
posterior fossa arachnoid cyst.

There is no acute infarct.

No chronic intracranial blood products.

No midline shift.

Vascular: Maintained flow voids within the proximal large arterial
vessels.

Skull and upper cervical spine: No focal suspicious marrow lesion.
Incompletely assessed cervical spondylosis.

Sinuses/Orbits: Visualized orbits show no acute finding. Small
mucous retention cyst within the left maxillary sinus. Trace mucosal
thickening within the bilateral ethmoid sinuses.

Other: 14 mm T2 hypointense lesion within the left parietal scalp,
nonspecific but likely reflecting a sebaceous cyst.

MRA HEAD FINDINGS

Anterior circulation:

The intracranial internal carotid arteries are patent. The M1 middle
cerebral arteries are patent. No M2 proximal branch occlusion or
high-grade proximal stenosis is identified. The anterior cerebral
arteries are patent. No intracranial aneurysm is identified.

Posterior circulation:

The intracranial vertebral arteries are patent. The basilar artery
is patent. The posterior cerebral arteries are patent. Fetal origin
right posterior cerebral artery. The left posterior communicating
artery is diminutive or absent.

Anatomic variants: As described.

MRA NECK FINDINGS

Aortic arch: Common origin of the innominate and left common carotid
arteries. No hemodynamically significant innominate or proximal
subclavian artery stenosis.

Right carotid system: CCA and ICA patent within the neck without
stenosis. Tortuosity of the cervical ICA.

Left carotid system: CCA and ICA patent within the neck without
stenosis. Tortuosity of the CCA.

Vertebral arteries: Vertebral arteries codominant and patent within
the neck without stenosis.
IMPRESSION: MRI brain:

1. Mild chronic small vessel ischemic changes within the cerebral
white matter.
2. 6 mm meningioma overlying the posterior left frontal lobe.
3. Mega cisterna magna versus incidental posterior fossa arachnoid
cyst.
4. Mild paranasal sinus disease, as described.

MRA head:

1. No intracranial large vessel occlusion or proximal high-grade
arterial stenosis.
2. Anatomic variation, as described.

MRA neck:

The common carotid, internal carotid and vertebral arteries are
patent within the neck without stenosis.

## 2021-12-05 MED ORDER — GADOBUTROL 1 MMOL/ML IV SOLN
9.5000 mL | Freq: Once | INTRAVENOUS | Status: AC | PRN
  Administered 2021-12-05: 9.5 mL via INTRAVENOUS

## 2021-12-06 ENCOUNTER — Telehealth: Payer: Self-pay | Admitting: Internal Medicine

## 2021-12-06 DIAGNOSIS — D329 Benign neoplasm of meninges, unspecified: Secondary | ICD-10-CM

## 2021-12-06 NOTE — Telephone Encounter (Signed)
Called to discuss MRI/MRA results. No vascular stenosis/occlusion concerning for cause of previously described symptoms. Incidental 6 mm meningioma and mega cisterna magna vs posterior fossa cyst noted, discussed these benign findings in detail with Ms. Higginbotham and do not suspect they were the cause of patients symptoms. Plan for f/u MRI in 6 months to follow meningioma. ?

## 2021-12-17 ENCOUNTER — Telehealth: Payer: Self-pay | Admitting: Internal Medicine

## 2021-12-17 ENCOUNTER — Other Ambulatory Visit: Payer: Self-pay | Admitting: Internal Medicine

## 2021-12-17 DIAGNOSIS — D329 Benign neoplasm of meninges, unspecified: Secondary | ICD-10-CM

## 2021-12-17 NOTE — Progress Notes (Signed)
Called Miranda Lopez to review rash after starting Saxenda (see MyChart message and photos). Stopped medication over the weekend and rash has significantly improved. She is feeling well and doing well with lifestyle changes, okay with stopping medication for now and continuing these modifications. Also discussed referral to neurosurgery for consultation of meningioma and mega cisterna magna vs posterior fossa arachnoid cyst and further recommendations for management/monitoring. ?

## 2021-12-17 NOTE — Telephone Encounter (Signed)
Erroneous encounter, please disregard

## 2021-12-18 ENCOUNTER — Ambulatory Visit
Admission: RE | Admit: 2021-12-18 | Discharge: 2021-12-18 | Disposition: A | Discharge status: Home / Self Care | Payer: Self-pay | Source: Ambulatory Visit | Attending: Cardiovascular Disease | Admitting: Cardiovascular Disease

## 2021-12-18 DIAGNOSIS — R002 Palpitations: Secondary | ICD-10-CM

## 2021-12-18 IMAGING — CT CT CARDIAC CORONARY ARTERY CALCIUM SCORE
3 series · 14 of 20 positions shown, 16 images · non-contrast
Comparison: None.
COMPARISON: None.

Addendum:
EXAM:
OVER-READ INTERPRETATION  CT CHEST

The following report is an over-read performed by radiologist Dr.
PAULUS N [REDACTED] on [DATE]. This
over-read does not include interpretation of cardiac or coronary
anatomy or pathology. The coronary calcium score/coronary CTA
interpretation by the cardiologist is attached.
CLINICAL DATA: Cardiovascular Disease Risk stratification
Coronary Calcium Score
TECHNIQUE: A gated, non-contrast computed tomography scan of the heart was
performed using 3mm slice thickness. Axial images were analyzed on a
dedicated workstation. Calcium scoring of the coronary arteries was
performed using the Agatston method.

[Series 2: cascseq 2.0 sa36 70% (id) · axial · 0.39mm/px · z∈[+1233,+1323]mm · 4 of 76 slices shown]
[im 16/76  vessel]
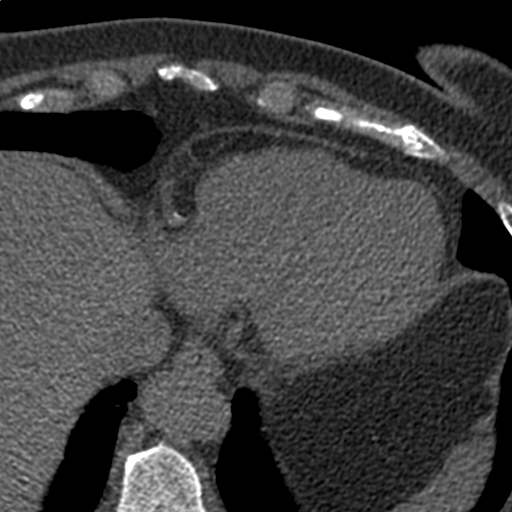
[im 31/76  vessel]
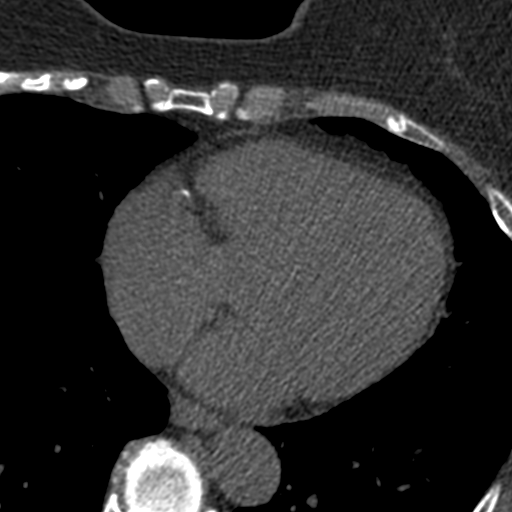
[im 46/76  vessel]
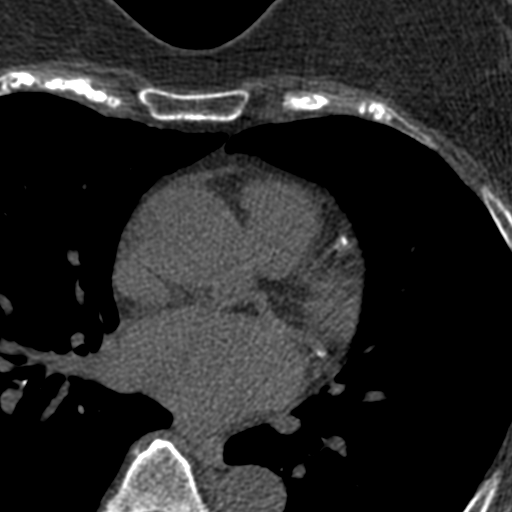
[im 61/76  vessel]
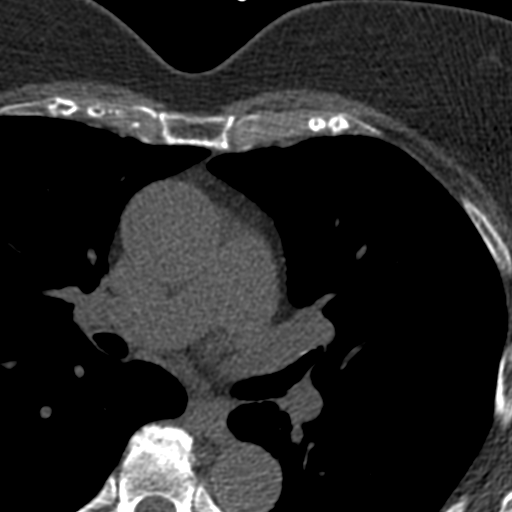

[Series 3: cascseq 2.0 bf37 st · axial · 0.73mm/px · z∈[+1227,+1327]mm · 5 of 76 slices shown, 7 images]
[im 13/76  vessel]
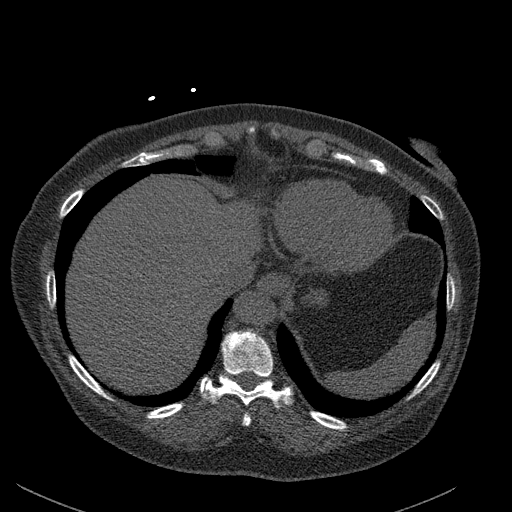
[im 13/76  lung]
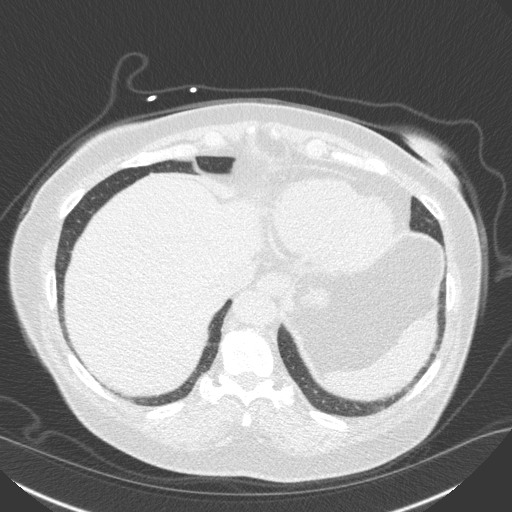
[im 26/76  vessel]
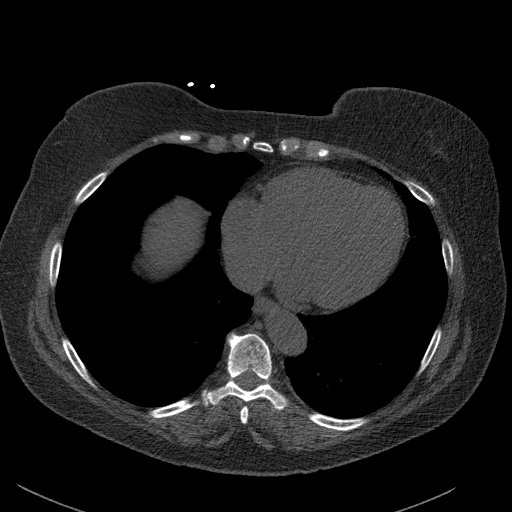
[im 38/76  vessel]
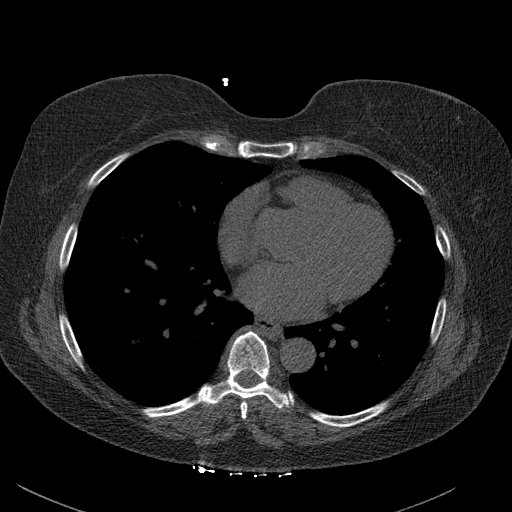
[im 51/76  vessel]
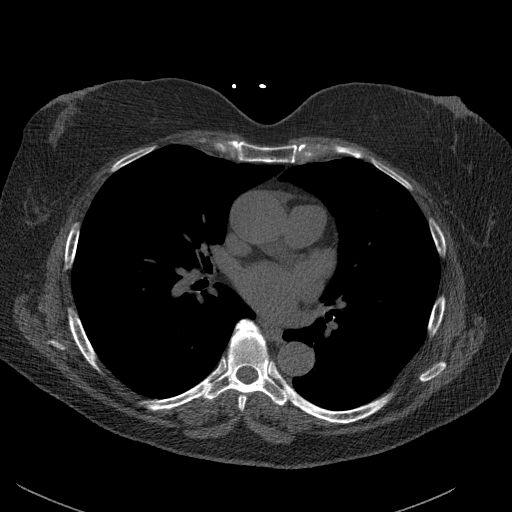
[im 63/76  vessel]
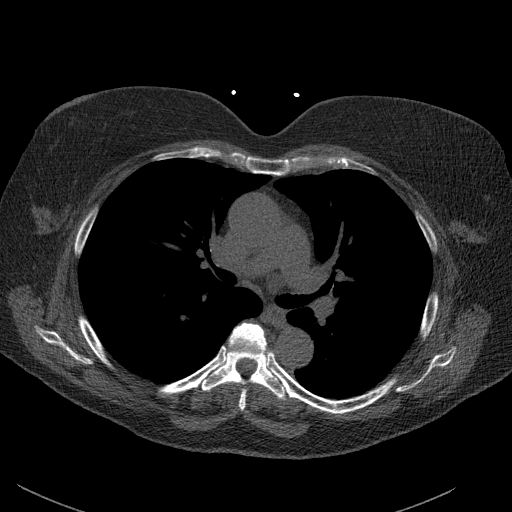
[im 63/76  lung]
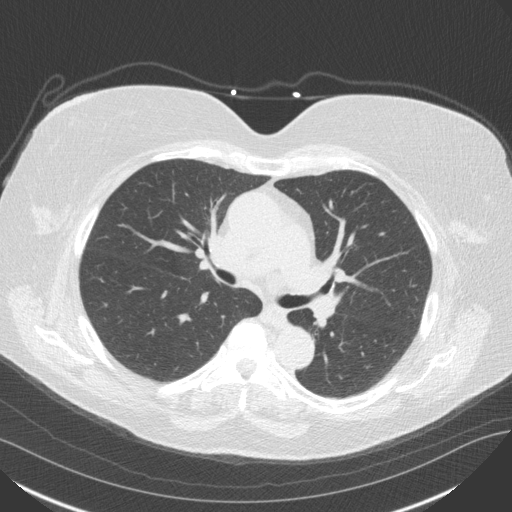

[Series 4: cascseq 2.0 br59 lung · axial · 0.73mm/px · z∈[+1227,+1327]mm · 5 of 76 slices shown]
[im 13/76  lung]
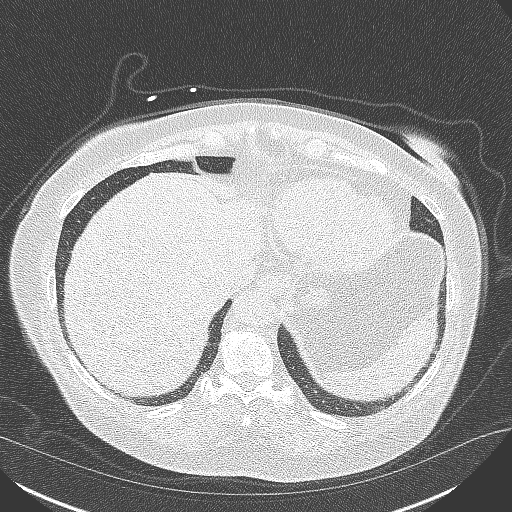
[im 26/76  lung]
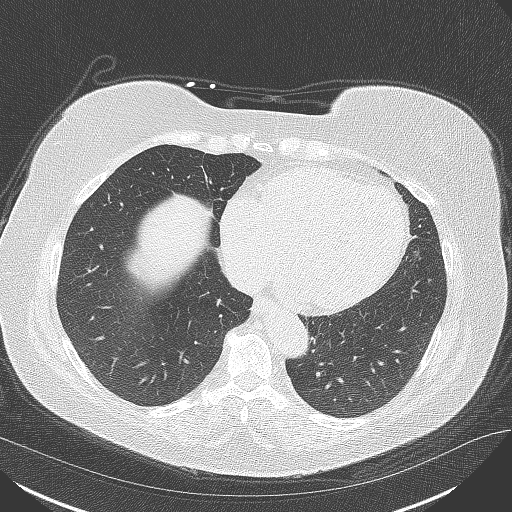
[im 38/76  lung]
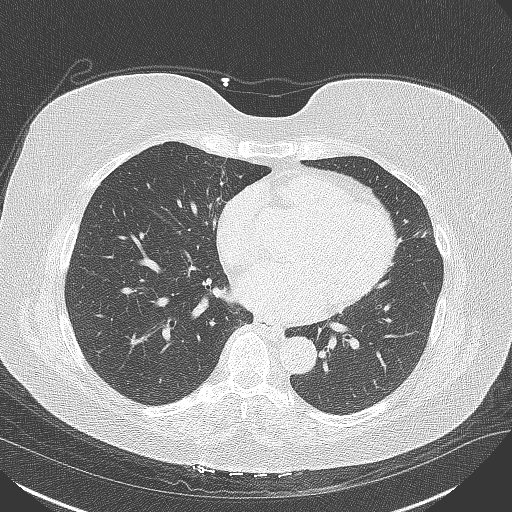
[im 51/76  lung]
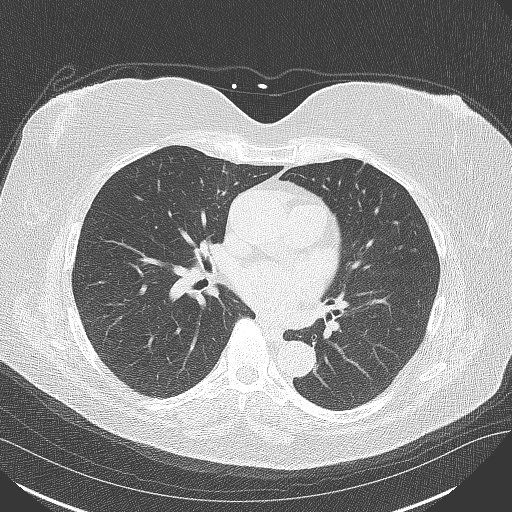
[im 63/76  lung]
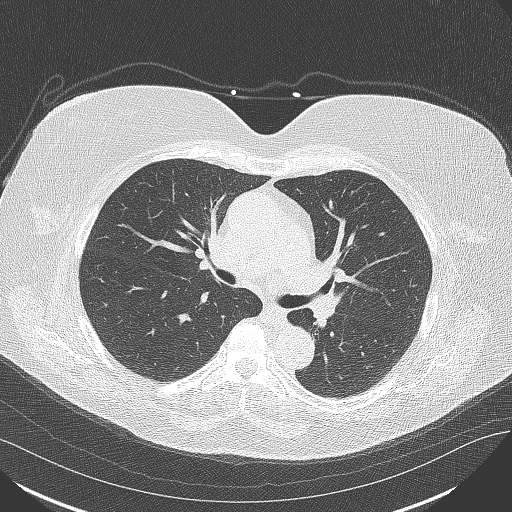

[14 of 20 positions shown; findings below may reference images not displayed]

FINDINGS: Vascular: Normal heart size. No pericardial effusion. Normal caliber
thoracic aorta with mild calcified plaque.

Mediastinum/Nodes: Esophagus is unremarkable. No pathologically
enlarged lymph nodes seen in the chest.

Lungs/Pleura: Central airways are patent. No consolidation, pleural
effusion or pneumothorax.

Upper Abdomen: No acute abnormality.

Musculoskeletal: No chest wall mass or suspicious bone lesions
identified.
IMPRESSION: No acute extracardiac abnormalities.
FINDINGS: Coronary arteries: Normal origins.

Coronary Calcium Score:

Left main: 0

Left anterior descending artery: 316

Left circumflex artery: 38

Right coronary artery: 183

Total: 537

Percentile: 94

Pericardium: Normal.

Ascending Aorta: Normal caliber.  Mild arch atherosclerosis.

Non-cardiac: See separate report from [REDACTED].
IMPRESSION: Coronary calcium score of 537. This was 94 percentile for age-,
race-, and sex-matched controls.



If CAC=0, it is reasonable to withhold statin therapy and reassess
in 5 to 10 years, as long as higher risk conditions are absent
(diabetes mellitus, family history of premature CHD in first degree
relatives (males <55 years; females <65 years), cigarette smoking,
or LDL >=190 mg/dL).

If CAC is 1 to 99, it is reasonable to initiate statin therapy for
patients >=55 years of age.

If CAC is >=100 or >=75th percentile, it is reasonable to initiate
statin therapy at any age.

Cardiology referral should be considered for patients with CAC
scores >=400 or >=75th percentile.

*[WD] AHA/ACC/AACVPR/AAPA/ABC/PAULUS N/PAULUS N/PAULUS N/PAULUS N/PAULUS N/PAULUS N/PAULUS N
Guideline on the Management of Blood Cholesterol: A Report of the
American College of Cardiology/American Heart Association Task Force
on Clinical Practice Guidelines. J Am Coll Cardiol.
[WD];73(24):[PHONE_NUMBER].

*** End of Addendum ***
EXAM:
OVER-READ INTERPRETATION  CT CHEST

The following report is an over-read performed by radiologist Dr.
PAULUS N [REDACTED] on [DATE]. This
over-read does not include interpretation of cardiac or coronary
anatomy or pathology. The coronary calcium score/coronary CTA
interpretation by the cardiologist is attached.
FINDINGS: Vascular: Normal heart size. No pericardial effusion. Normal caliber
thoracic aorta with mild calcified plaque.

Mediastinum/Nodes: Esophagus is unremarkable. No pathologically
enlarged lymph nodes seen in the chest.

Lungs/Pleura: Central airways are patent. No consolidation, pleural
effusion or pneumothorax.

Upper Abdomen: No acute abnormality.

Musculoskeletal: No chest wall mass or suspicious bone lesions
identified.
IMPRESSION: No acute extracardiac abnormalities.

## 2021-12-19 ENCOUNTER — Telehealth: Payer: Self-pay

## 2021-12-19 DIAGNOSIS — R931 Abnormal findings on diagnostic imaging of heart and coronary circulation: Secondary | ICD-10-CM

## 2021-12-19 DIAGNOSIS — R002 Palpitations: Secondary | ICD-10-CM

## 2021-12-19 DIAGNOSIS — I25119 Atherosclerotic heart disease of native coronary artery with unspecified angina pectoris: Secondary | ICD-10-CM

## 2021-12-19 NOTE — Telephone Encounter (Signed)
Will send the following instructions to patient's Mychart. ? ?How to Prepare for Your Cardiac PET/CT Stress Test: ? ?1. Please do not take these medications before your test:  ?There are no medications on your list that would need to be held for this test ?Your remaining medications may be taken with water. ? ?2. Nothing to eat or drink, except water, 3 hours prior to arrival time.   ?NO caffeine/decaffeinated products, or chocolate 12 hours prior to arrival. ? ?3. NO perfume, cologne or lotion ? ?4. Total time is 1 to 2 hours; you may want to bring reading material for the waiting time. ? ?5. Please report to Admitting at the [facility] 60 minutes early for your test. ? [address] ? [address], [place] [id number] ? ? ?In preparation for your appointment, medication and supplies will be purchased.  Appointment availability is limited, so if you need to cancel or reschedule, please call the Radiology Department at [phone]  24 hours in advance to avoid a cancellation fee of $100.00 ? ?What to Expect After you Arrive: ? ?Once you arrive and check in for your appointment, you will be taken to a preparation room within the Radiology Department.  A technologist or Nurse will obtain your medical history, verify that you are correctly prepped for the exam, and explain the procedure.  Afterwards,  an IV will be started in your arm and electrodes will be placed on your skin for EKG monitoring during the stress portion of the exam. Then you will be escorted to the PET/CT scanner.  There, staff will get you positioned on the scanner and obtain a blood pressure and EKG.  During the exam, you will continue to be connected to the EKG and blood pressure machines.  A small, safe amount of a radioactive tracer will be injected in your IV to obtain a series of pictures of your heart along with an injection of a stress agent.   ? ?After your Exam: ? ?It is recommended that you eat a meal and drink a  caffeinated beverage to counter act any effects of the stress agent.  Drink plenty of fluids for the remainder of the day and urinate frequently for the first couple of hours after the exam.  Your doctor will inform you of your test results within 7-10 business days. ? ?For questions about your test or how to prepare for your test, please call: ?[name], Cardiac Imaging Nurse Navigator  ?Gordy Clement, Cardiac Imaging Nurse Navigator ?Office: [phone] ? ?

## 2021-12-19 NOTE — Telephone Encounter (Signed)
Left message for patient to call back  

## 2021-12-19 NOTE — Telephone Encounter (Signed)
Patient called back. Informed patient of results. Patient agreed to have cardiac PET/CT done, but wants to discuss medication, Crestor, with her PCP on 01/10/22. ?

## 2021-12-19 NOTE — Telephone Encounter (Signed)
-----   Message from Josue Hector, MD sent at 12/18/2021  8:14 PM EDT ----- ?Her calcium score is very high should start crestor 10 mg daily and order cardiac PET/CT to further risk stratify for obstructive CAD ?

## 2021-12-20 ENCOUNTER — Telehealth: Payer: Self-pay | Admitting: Internal Medicine

## 2021-12-20 DIAGNOSIS — R931 Abnormal findings on diagnostic imaging of heart and coronary circulation: Secondary | ICD-10-CM

## 2021-12-20 DIAGNOSIS — E785 Hyperlipidemia, unspecified: Secondary | ICD-10-CM

## 2021-12-20 MED ORDER — ROSUVASTATIN CALCIUM 10 MG PO TABS: 10.0000 mg | ORAL_TABLET | Freq: Every day | ORAL | 3 refills | Status: DC

## 2021-12-20 NOTE — Telephone Encounter (Signed)
Called [facility] in response to MyChart message regarding CAC score and cardiology recommendation for cardiac PET. We reviewed the test results and indication for cardiac PET which I agree with. We also discussed starting statin therapy given high CAC and elevated cardiac risk, I agree with this and will send in prescription for Crestor 10 mg daily.  ?

## 2021-12-31 ENCOUNTER — Ambulatory Visit (INDEPENDENT_AMBULATORY_CARE_PROVIDER_SITE_OTHER): Payer: PPO | Admitting: Internal Medicine

## 2021-12-31 ENCOUNTER — Other Ambulatory Visit: Payer: Self-pay

## 2021-12-31 ENCOUNTER — Encounter: Payer: Self-pay | Admitting: Internal Medicine

## 2021-12-31 VITALS — BP 130/80 | HR 70 | Temp 98.4°F | Ht 67.0 in | Wt 195.8 lb

## 2021-12-31 DIAGNOSIS — E785 Hyperlipidemia, unspecified: Secondary | ICD-10-CM

## 2021-12-31 DIAGNOSIS — Z6830 Body mass index (BMI) 30.0-30.9, adult: Secondary | ICD-10-CM | POA: Diagnosis not present

## 2021-12-31 DIAGNOSIS — I10 Essential (primary) hypertension: Secondary | ICD-10-CM

## 2021-12-31 DIAGNOSIS — Z23 Encounter for immunization: Secondary | ICD-10-CM

## 2021-12-31 DIAGNOSIS — D329 Benign neoplasm of meninges, unspecified: Secondary | ICD-10-CM

## 2021-12-31 DIAGNOSIS — I340 Nonrheumatic mitral (valve) insufficiency: Secondary | ICD-10-CM

## 2021-12-31 DIAGNOSIS — E669 Obesity, unspecified: Secondary | ICD-10-CM

## 2021-12-31 DIAGNOSIS — R931 Abnormal findings on diagnostic imaging of heart and coronary circulation: Secondary | ICD-10-CM | POA: Diagnosis not present

## 2021-12-31 NOTE — Progress Notes (Signed)
?Subjective:  ? ?Patient ID: Miranda Lopez female   DOB: Jan 12, 1956 66 y.o.   MRN: [id number] ? ?HPI: ?[name] is a 66 y.o. person living with hypertension, hyperlipidemia, prediabetes, and obesity.  ? ?For details of today's visit, please see problem-based charting. ? ?Past Medical History:  ?Diagnosis Date  ? Cancer [facility])   ? cervical cancer  ? Contusion of head, subsequent encounter 11/29/2020  ? COVID-19   ? patient reported 07/2018  ? Diverticulitis   ? last epiosde 10-11 yrs ago  ? Eclampsia   ? Screening for colon cancer 11/29/2020  ? Hx of adenomatous colonic polyps  ? ?Current Outpatient Medications  ?Medication Sig Dispense Refill  ? amLODipine (NORVASC) 10 MG tablet Take 1 tablet (10 mg total) by mouth daily. 30 tablet 11  ? EPINEPHrine 0.3 mg/0.3 mL IJ SOAJ injection Inject 0.3 mg into the muscle as needed for anaphylaxis. 4 each 3  ? rosuvastatin (CRESTOR) 10 MG tablet Take 1 tablet (10 mg total) by mouth daily. 90 tablet 3  ? ?No current facility-administered medications for this visit.  ? ?Family History  ?Problem Relation Age of Onset  ? Heart failure Father 36  ? CAD Father 71  ?     Heart attack and quadruple bypass at age 62.  ? Colon cancer Neg Hx   ? Colon polyps Neg Hx   ? Esophageal cancer Neg Hx   ? Rectal cancer Neg Hx   ? Stomach cancer Neg Hx   ? ?Social History  ? ?Socioeconomic History  ? Marital status: Single  ?  Spouse name: Not on file  ? Number of children: Not on file  ? Years of education: Not on file  ? Highest education level: Not on file  ?Occupational History  ? Not on file  ?Tobacco Use  ? Smoking status: Never  ? Smokeless tobacco: Never  ?Substance and Sexual Activity  ? Alcohol use: Yes  ?  Alcohol/week: 7.0 standard drinks  ?  Types: 7 Glasses of wine per week  ?  Comment: wine with dinner nightly  ? Drug use: Not on file  ? Sexual activity: Not on file  ?Other Topics Concern  ? Not on file  ?Social History Narrative  ? Currently working, owns two  businesses.  ? ?Social Determinants of Health  ? ?Financial Resource Strain: Not on file  ?Food Insecurity: Not on file  ?Transportation Needs: Not on file  ?Physical Activity: Not on file  ?Stress: Not on file  ?Social Connections: Not on file  ? ?Review of Systems: ?Cardiovascular: positive for lower extremity edema, negative for chest pain, chest pressure/discomfort, dyspnea, exertional chest pressure/discomfort, near-syncope, and syncope ?Objective:  ?Physical Exam: ?Vitals:  ? 12/31/21 0914  ?BP: 130/80  ?Pulse: 70  ?Temp: 98.4 ?F (36.9 ?C)  ?TempSrc: Oral  ?SpO2: 100%  ?Weight: 195 lb 12.8 oz (88.8 kg)  ?Height: '5\' 7"'$  (1.702 m)  ? ?General: appears well, no distress ?Resp: breathing comfortably on room air, no distress or increased effort ?Skin: no rashes or wounds on exposed skin ?Extremities: no evident lower extremity edema ? ?Assessment & Plan:  ? ?See Encounters tab for problem-based charting. ? ?Miranda Lopez was seen today for follow-up. ? ?Diagnoses and all orders for this visit: ? ?Hypertension, unspecified type ? ?Hyperlipidemia, unspecified hyperlipidemia type ? ?Obesity (BMI 30-39.9) ? ?Elevated coronary artery calcium score ? ?Need for Tdap vaccination ? ?  ? ?Return in about 3 months (around 04/02/2022) for f/u BP. ? ?  Charise Killian, MD ? ?

## 2021-12-31 NOTE — Assessment & Plan Note (Signed)
Elevated CAC score found 12/18/21. Total score in the severe disease category, cardiology recommending further evaluation for risk stratification with cardiac PET. Percentile 94 for age/sex. We reviewed these results today and need for further testing. She is not experiencing any symptoms of ischemic heart disease. Ms. Gittens has started on rosuvastatin and remains very physically active. She is working on weight loss and is down 14 lbs! Encouraged continued healthy lifestyle measures and will follow-up cardiac evaluation at the end of this month. ?

## 2021-12-31 NOTE — Assessment & Plan Note (Signed)
Miranda Lopez has successfully lost 14 lbs! She is s/p 4 week trial of semaglutide, switched to liraglutide for financial reasons, however had to stop due to reaction at the injection site. Following discontinuation of these medication she has been able to maintain weight loss and made significant changes to her diet. She would like to lose about 15 more pounds, working on dietary changes.  ?

## 2021-12-31 NOTE — Assessment & Plan Note (Signed)
Started on rosuvastatin 10 mg following elevated CAC score. She has been adherent to this medication since 4/27. We discussed plan for recheck of lipid panel in about 8 weeks from start date. Miranda Lopez will return for labs only visit. ?

## 2021-12-31 NOTE — Assessment & Plan Note (Signed)
MRI head with incidental 6 mm meningioma overlying left posterior frontal lobe. We discussed this finding and likely benign nature. Given additional incidental posterior fossa findings, we discussed referral to neurosurgery for evaluation and any further management recommendations. Miranda Lopez is scheduled for an appointment later this week. ?

## 2021-12-31 NOTE — Assessment & Plan Note (Signed)
Blood pressure at goal 130/80 on amlodipine 10 mg daily. Miranda Lopez reports home pressures have improved, yesterday systolic 508L. No episodes of chest pain, shortness of breath, dizziness, syncope. She has noticed intermittent lower extremity/ankle swelling that improves with elevation. We discussed this may be due to amlodipine, however [name] reports edema is not bothersome enough to stop medication at this time. Continue to monitor and consider alternative BP medication if persistent and/or bothersome edema occurs. ?

## 2021-12-31 NOTE — Assessment & Plan Note (Signed)
Completed Tdap at pharmacy 11/14/21. Immunization record updated. ?

## 2022-01-04 ENCOUNTER — Ambulatory Visit (HOSPITAL_COMMUNITY): Payer: PPO

## 2022-01-04 DIAGNOSIS — D320 Benign neoplasm of cerebral meninges: Secondary | ICD-10-CM | POA: Diagnosis not present

## 2022-01-18 ENCOUNTER — Telehealth (HOSPITAL_COMMUNITY): Payer: Self-pay | Admitting: Emergency Medicine

## 2022-01-18 NOTE — Telephone Encounter (Signed)
Reaching out to patient to offer assistance regarding upcoming cardiac imaging study; pt verbalizes understanding of appt date/time, parking situation and where to check in, pre-test NPO status and medications ordered, and verified current allergies; name and call back number provided for further questions should they arise Miranda Bond RN [facility] [phone] office [phone] cell  Pt frustrated over the number of calls preparing her for this test. Cut me off and did not want to review instructions

## 2022-01-22 ENCOUNTER — Encounter (HOSPITAL_COMMUNITY)
Admission: RE | Admit: 2022-01-22 | Discharge: 2022-01-22 | Disposition: A | Discharge status: Home / Self Care | Payer: PPO | Source: Ambulatory Visit | Attending: Cardiovascular Disease | Admitting: Cardiovascular Disease

## 2022-01-22 DIAGNOSIS — I25119 Atherosclerotic heart disease of native coronary artery with unspecified angina pectoris: Secondary | ICD-10-CM

## 2022-01-22 DIAGNOSIS — R002 Palpitations: Secondary | ICD-10-CM | POA: Diagnosis not present

## 2022-01-22 DIAGNOSIS — R931 Abnormal findings on diagnostic imaging of heart and coronary circulation: Secondary | ICD-10-CM | POA: Diagnosis not present

## 2022-01-22 IMAGING — PT NM PET CT CARDIAC PERF MULTI W/ABSOLUTE BLOODFLOW
12 series · 25 of 25 positions shown · non-contrast
Comparison: Coronary calcium score CT scan [DATE]

CLINICAL DATA: This over-read does not include interpretation of
cardiac or coronary anatomy or pathology. The cardiac PET-CT
interpretation by the cardiologist is attached.

[Series 2: ac ct rest · axial · 3.0mm · 1.52mm/px · 1 of 83 slices shown]
[im 1/83  soft-tissue]
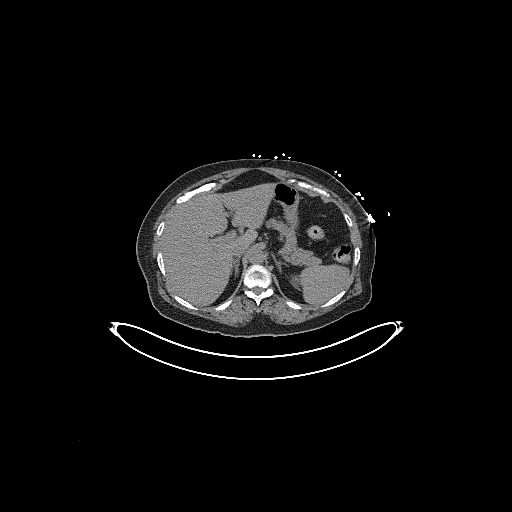

[Series 3: ct rest · axial · 3.0mm · 0.97mm/px · 1 of 83 slices shown]
[im 1/83  soft-tissue]
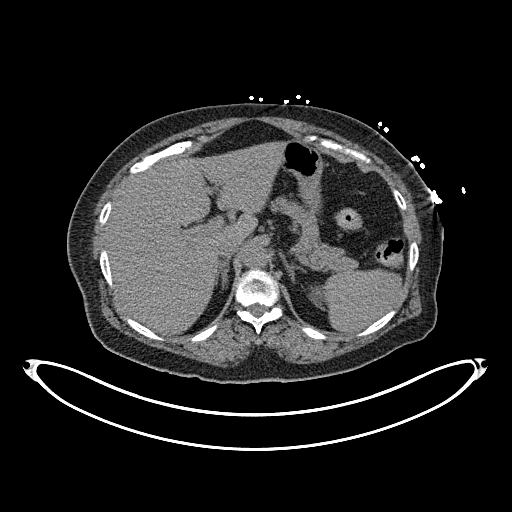

[Series 4: ct rest lung · axial · 3.0mm · 0.97mm/px · 1 of 83 slices shown]
[im 1/83  soft-tissue]
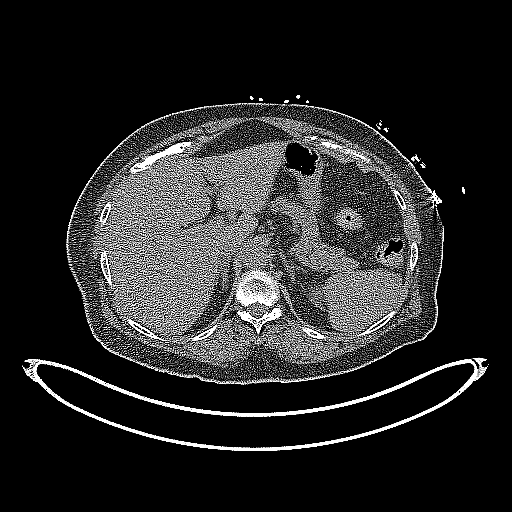

[Series 5: pet rest cardiac nac static · axial · 3.0mm · 2.04mm/px · 1 of 83 slices shown]
[im 1/83]
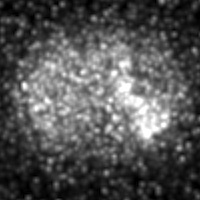

[Series 7: pet rest cardiac dynamic · axial · 3.0mm · 2.04mm/px · z∈[-466,-302]mm · 6 of 2218 slices shown]
[im 1/2218]
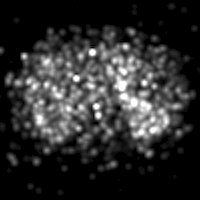
[im 444/2218]
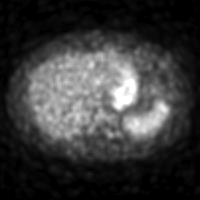
[im 887/2218]
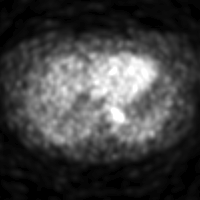
[im 1331/2218  full-range]
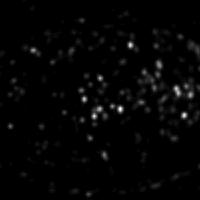
[im 1774/2218]
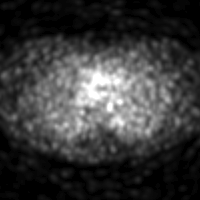
[im 2218/2218]
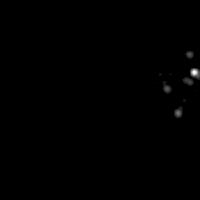

[Series 8: pet rest cardiac gated · axial · 3.0mm · 2.04mm/px · 1 of 664 slices shown]
[im 1/664]
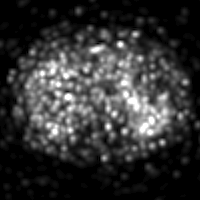

[Series 9: pet stress cardiac nac static · axial · 3.0mm · 2.04mm/px · 1 of 83 slices shown]
[im 1/83]
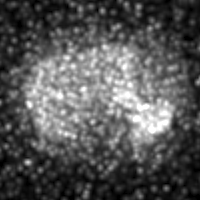

[Series 10: pet rest cardiac static · axial · 3.0mm · 2.04mm/px · 1 of 83 slices shown]
[im 1/83]
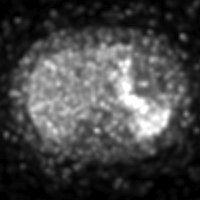

[Series 11: pet stress cardiac gated · axial · 3.0mm · 2.04mm/px · z∈[-466,-302]mm · 2 of 664 slices shown]
[im 1/664]
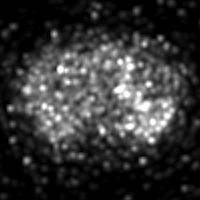
[im 664/664]
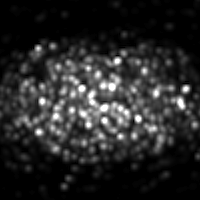

[Series 12: pet stress cardiac dynamic · axial · 3.0mm · 2.04mm/px · z∈[-466,-302]mm · 8 of 2393 slices shown]
[im 1/2393]
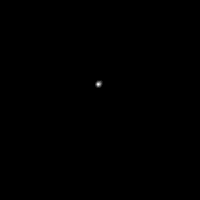
[im 342/2393]
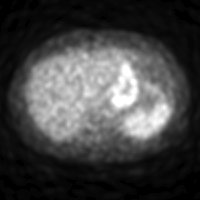
[im 684/2393]
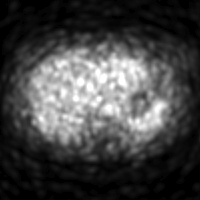
[im 1026/2393]
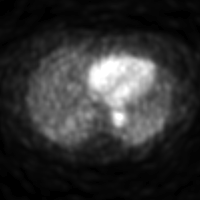
[im 1367/2393]
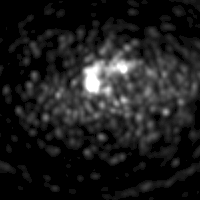
[im 1709/2393]
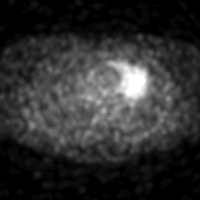
[im 2051/2393]
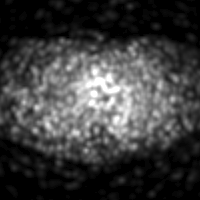
[im 2393/2393]
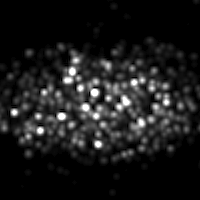

[Series 13: pet stress cardiac ac static · axial · 3.0mm · 2.04mm/px · 1 of 83 slices shown]
[im 1/83]
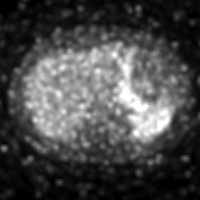

[Series 101: ac ct rest rtd · axial · 4.0mm · 0.98mm/px · 1 of 42 slices shown]
[im 1/42  soft-tissue]
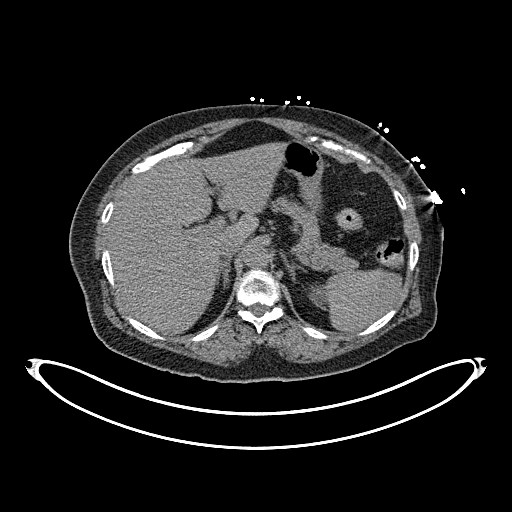

[25 of 25 positions shown; findings below may reference images not displayed]

FINDINGS: Vascular: Normal caliber thoracic aorta. No pericardial effusion.
Coronary artery calcifications noted.

Mediastinum/Nodes: No mediastinal or hilar mass or adenopathy. The
esophagus is grossly normal.

Lungs/Pleura: No acute pulmonary findings or worrisome pulmonary
lesions.

Upper Abdomen: No significant upper abdominal findings.

Musculoskeletal: No significant bony findings.
IMPRESSION: 1. No significant extracardiac findings.
2. Coronary artery calcifications noted.

## 2022-01-22 MED ORDER — REGADENOSON 0.4 MG/5ML IV SOLN
INTRAVENOUS | Status: AC
  Administered 2022-01-22: 0.4 mg via INTRAVENOUS
  Filled 2022-01-22: qty 5

## 2022-01-22 MED ORDER — RUBIDIUM RB82 GENERATOR (RUBYFILL)
23.5900 | PACK | Freq: Once | INTRAVENOUS | Status: AC
  Administered 2022-01-22: 23.59 via INTRAVENOUS

## 2022-01-22 MED ORDER — RUBIDIUM RB82 GENERATOR (RUBYFILL)
23.5500 | PACK | Freq: Once | INTRAVENOUS | Status: AC
  Administered 2022-01-22: 23.55 via INTRAVENOUS

## 2022-01-23 LAB — NM PET CT CARDIAC PERFUSION MULTI W/ABSOLUTE BLOODFLOW
MBFR: 2.58
Nuc Rest EF: 62 %
Nuc Stress EF: 71 %
Peak HR: 77 {beats}/min
Rest HR: 61 {beats}/min
Rest MBF: 0.73 ml/g/min
Rest Nuclear Isotope Dose: 23.6 mCi
Rest perfusion cavity size (mL): 94 mL
ST Depression (mm): 0 mm
Stress MBF: 1.88 ml/g/min
Stress Nuclear Isotope Dose: 23.5 mCi
Stress perfusion cavity size (mL): 109 mL
TID: 1.1

## 2022-01-25 ENCOUNTER — Encounter: Payer: Self-pay | Admitting: *Deleted

## 2022-01-25 ENCOUNTER — Ambulatory Visit (INDEPENDENT_AMBULATORY_CARE_PROVIDER_SITE_OTHER): Payer: PPO | Admitting: Student

## 2022-01-25 DIAGNOSIS — L237 Allergic contact dermatitis due to plants, except food: Secondary | ICD-10-CM

## 2022-01-25 MED ORDER — PREDNISONE 20 MG PO TABS: 40.0000 mg | ORAL_TABLET | Freq: Every day | ORAL | 0 refills | Status: AC

## 2022-01-27 DIAGNOSIS — L237 Allergic contact dermatitis due to plants, except food: Secondary | ICD-10-CM | POA: Insufficient documentation

## 2022-01-27 HISTORY — DX: Allergic contact dermatitis due to plants, except food: L23.7

## 2022-01-27 NOTE — Progress Notes (Signed)
  [facility] Internal Medicine Residency Telephone Encounter Continuity Care Appointment  HPI:  This telephone encounter was created for Ms. Miranda Lopez on 01/25/2022 for the following purpose/cc Rash.   Past Medical History:  Past Medical History:  Diagnosis Date   Cancer [facility])    cervical cancer   Contusion of head, subsequent encounter 11/29/2020   COVID-19    patient reported 07/2018   Diverticulitis    last epiosde 10-11 yrs ago   Eclampsia    Screening for colon cancer 11/29/2020   Hx of adenomatous colonic polyps     ROS:  Please see problem based charting under encounters tab for further details.     Assessment / Plan / Recommendations:  Please see A&P under problem oriented charting for assessment of the patient's acute and chronic medical conditions.  As always, pt is advised that if symptoms worsen or new symptoms arise, they should go to an urgent care facility or to to ER for further evaluation.   Consent and Medical Decision Making:  Patient discussed with Dr.  Cain Sieve This is a telephone encounter between Miranda Lopez and Rick Duff on 01/25/2022 for rash. The visit was conducted with the patient located at home and Rick Duff at [facility]. The patient's identity was confirmed using their DOB and current address. The patient has consented to being evaluated through a telephone encounter and understands the associated risks (an examination cannot be done and the patient may need to come in for an appointment) / benefits (allows the patient to remain at home, decreasing exposure to coronavirus). I personally spent 15 minutes on medical discussion.

## 2022-01-27 NOTE — Assessment & Plan Note (Signed)
Patient developed a rash along her arms and L leg after what she feels was exposure to poison ivy. This happened aobut 10-11d ago. The patient reports that she has noticed some blisters develop that have unroofed and crusted over. There was mostly serous drainage but one of the lesions ~2 d ago had what appeared to be pus. She has tried calamine lotion which has helped her symptoms some, but the night prior to her telehealth visit she noticed that her itching was worse and she felt like she needed to be seen. She denies any sick contacts, no other individuals around her have a rash, she has no systemic signs of infection.  -Given the persistent nature of her pruritis, will prescribe systemic corticosteroid. Discussed with patient if symptoms do not improve with 7d course to call our office and schedule an in person visit. Patient is agreeable with this.

## 2022-01-28 NOTE — Progress Notes (Signed)
Internal Medicine Clinic Attending  Case discussed with Dr. Carter  At the time of the visit.  We reviewed the resident's history and exam and pertinent patient test results.  I agree with the assessment, diagnosis, and plan of care documented in the resident's note.  

## 2022-02-21 ENCOUNTER — Other Ambulatory Visit (INDEPENDENT_AMBULATORY_CARE_PROVIDER_SITE_OTHER): Payer: PPO

## 2022-02-21 DIAGNOSIS — E785 Hyperlipidemia, unspecified: Secondary | ICD-10-CM | POA: Diagnosis not present

## 2022-02-22 LAB — LIPID PANEL
Chol/HDL Ratio: 2.2 ratio (ref 0.0–4.4)
Cholesterol, Total: 184 mg/dL (ref 100–199)
HDL: 83 mg/dL (ref >39)
LDL Chol Calc (NIH): 85 mg/dL (ref 0–99)
Triglycerides: 88 mg/dL (ref 0–149)
VLDL Cholesterol Cal: 16 mg/dL (ref 5–40)

## 2022-02-25 ENCOUNTER — Telehealth: Payer: Self-pay | Admitting: Internal Medicine

## 2022-02-25 NOTE — Telephone Encounter (Signed)
Called Ms. Cong to review lab results. All questions answered. She is to call in to schedule an appointment with me in August.

## 2022-02-25 NOTE — Progress Notes (Signed)
Improved cholesterol with lifestyle changes, weight loss, and statin. Reviewed with Ms. Capley via telephone. Continue current statin, will reassess at f/u.

## 2022-02-25 NOTE — Telephone Encounter (Signed)
Pt requesting a call back  about her lab results.

## 2022-04-12 ENCOUNTER — Other Ambulatory Visit: Payer: Self-pay | Admitting: Internal Medicine

## 2022-04-15 ENCOUNTER — Ambulatory Visit (INDEPENDENT_AMBULATORY_CARE_PROVIDER_SITE_OTHER): Payer: PPO | Admitting: Internal Medicine

## 2022-04-15 ENCOUNTER — Other Ambulatory Visit: Payer: Self-pay

## 2022-04-15 ENCOUNTER — Encounter: Payer: Self-pay | Admitting: Internal Medicine

## 2022-04-15 VITALS — BP 155/88 | HR 68 | Temp 97.9°F | Ht 67.0 in | Wt 194.7 lb

## 2022-04-15 DIAGNOSIS — I10 Essential (primary) hypertension: Secondary | ICD-10-CM | POA: Diagnosis not present

## 2022-04-15 DIAGNOSIS — E785 Hyperlipidemia, unspecified: Secondary | ICD-10-CM | POA: Diagnosis not present

## 2022-04-15 DIAGNOSIS — D329 Benign neoplasm of meninges, unspecified: Secondary | ICD-10-CM | POA: Diagnosis not present

## 2022-04-15 DIAGNOSIS — Z91030 Bee allergy status: Secondary | ICD-10-CM | POA: Diagnosis not present

## 2022-04-15 DIAGNOSIS — R931 Abnormal findings on diagnostic imaging of heart and coronary circulation: Secondary | ICD-10-CM

## 2022-04-15 MED ORDER — EPINEPHRINE 0.3 MG/0.3ML IJ SOAJ: 0.3000 mg | INTRAMUSCULAR | 0 refills | Status: DC | PRN

## 2022-04-15 NOTE — Assessment & Plan Note (Signed)
Blood pressure above goal 130/80 on initial and recheck. Miranda Lopez notes she stopped taking her amlodipine around 02/26/22 due to increased leg swelling which was uncomfortable and caused her to change her gait leading to worsening knee pain. Miranda Lopez notes she may have been taking two '10mg'$  tablets around the time she noticed increased swelling. Since that time, her home blood pressures have been mostly 120s/70s in the evenings when relaxed. She does not check often during the work day. She remains without chest pain, SOB, palpitations, or dizziness. Lower extremity swelling has resolved. Home pressures are great, however we discussed further monitoring of home pressures at different times throughout the day, as well as appropriate technique, and she will let me know if consistently >130-140/80-90. I advised her to let me know her readings before resuming medication. She continues to be very active with hiking and golf, weight remains stable.  Plan -Continue home monitoring of BP -Return in 3 months

## 2022-04-15 NOTE — Assessment & Plan Note (Signed)
Improvement in lipids seen on testing in 01/2022, while on statin therapy. Miranda Lopez stopped around 02/26/22 as she noted normal results and has been working hard on lifestyle changes. We will recheck today and discuss need for further statin therapy pending results.  Plan -Lipid panel

## 2022-04-15 NOTE — Progress Notes (Signed)
.  Established Patient Office Visit  Subjective   Patient ID: Miranda Lopez, female    DOB: [birth year]  Age: 66 y.o. MRN: [id number]  Chief Complaint  Patient presents with   3 Month F/U Visit    Discuss scans.   Medication Refill   Discuss meds.    Miranda Lopez returns to clinic today for f/u of hypertension, hyperlipidemia, meningioma, and weight management. Since last visit, she has stopped taking amlodipine and rosuvastatin. She is requesting refill of her EpiPen. Please see problem-based charting for further details of today's visit.   Patient Active Problem List   Diagnosis Date Noted   Meningioma ([place]) 12/31/2021   Elevated coronary artery calcium score 12/20/2021   Vulvar cyst 11/12/2021   Prediabetes 10/15/2021   Cervical neoplasm 10/15/2021   Obesity (BMI 30-39.9) 10/15/2021   Mild mitral regurgitation 10/15/2021   Skin tag 10/15/2021   Hx of adenomatous polyp of colon 03/17/2021   Epidermal inclusion cyst 12/14/2020   Allergy to honey bee venom 11/29/2020   Hyperlipidemia 11/29/2020   Hypertension 11/29/2020   Review of Systems  Respiratory:  Negative for shortness of breath.   Cardiovascular:  Negative for chest pain, palpitations and leg swelling.  Neurological:  Negative for dizziness.     Objective:     BP (!) 155/88 (BP Location: Right Arm, Cuff Size: Normal)   Pulse 68   Temp 97.9 F (36.6 C) (Oral)   Ht '5\' 7"'$  (1.702 m)   Wt 194 lb 11.2 oz (88.3 kg)   SpO2 99% Comment: RA  BMI 30.49 kg/m  BP Readings from Last 3 Encounters:  04/15/22 (!) 155/88  12/31/21 130/80  11/30/21 136/74   Wt Readings from Last 3 Encounters:  04/15/22 194 lb 11.2 oz (88.3 kg)  12/31/21 195 lb 12.8 oz (88.8 kg)  11/30/21 200 lb (90.7 kg)      Physical Exam Constitutional:      General: She is not in acute distress.    Appearance: Normal appearance.  Cardiovascular:     Rate and Rhythm: Normal rate and regular rhythm.  Pulmonary:     Effort: Pulmonary  effort is normal.     Breath sounds: Normal breath sounds.  Musculoskeletal:     Right lower leg: No edema.     Left lower leg: No edema.  Neurological:     General: No focal deficit present.     Mental Status: She is alert.  Psychiatric:        Mood and Affect: Mood normal.        Behavior: Behavior normal.      Assessment & Plan:   Problem List Items Addressed This Visit       Cardiovascular and Mediastinum   Hypertension    Blood pressure above goal 130/80 on initial and recheck. Miranda Lopez notes she stopped taking her amlodipine around 02/26/22 due to increased leg swelling which was uncomfortable and caused her to change her gait leading to worsening knee pain. Miranda Lopez notes she may have been taking two '10mg'$  tablets around the time she noticed increased swelling. Since that time, her home blood pressures have been mostly 120s/70s in the evenings when relaxed. She does not check often during the work day. She remains without chest pain, SOB, palpitations, or dizziness. Lower extremity swelling has resolved. Home pressures are great, however we discussed further monitoring of home pressures at different times throughout the day, as well as appropriate technique, and she will let me know  if consistently >130-140/80-90. I advised her to let me know her readings before resuming medication. She continues to be very active with hiking and golf, weight remains stable.  Plan -Continue home monitoring of BP -Return in 3 months      Relevant Medications   EPINEPHrine 0.3 mg/0.3 mL IJ SOAJ injection   Elevated coronary artery calcium score    Completed cardiac PET scan per cardiology given CAC score. No evidence of ischemia or infarction noted on PET. Study was noted to be normal and low risk. Moderate coronary artery calcifications were present. No signs/symptoms of ischemic heart disease at this time.       Relevant Medications   EPINEPHrine 0.3 mg/0.3 mL IJ SOAJ injection      Nervous and Auditory   Meningioma ([place])    Ms. Frede was able to see neurosurgery for very small meningioma and posterior fossa findings. I am unable to see their records, however she states they were not concerned and noted findings were normal. We have requested records today for review. Unclear if repeat imaging to monitor is warranted, will f/u records request.        Other   Allergy to honey bee venom    Refill of Epipen provided today as her supply will expire in 05/2022. Miranda Lopez notes very severe allergy to bees, wasps, hornets, etc. She has not had to use a pen in some time.      Relevant Medications   EPINEPHrine 0.3 mg/0.3 mL IJ SOAJ injection   Hyperlipidemia - Primary    Improvement in lipids seen on testing in 01/2022, while on statin therapy. Ms. Folmer stopped around 02/26/22 as she noted normal results and has been working hard on lifestyle changes. We will recheck today and discuss need for further statin therapy pending results.  Plan -Lipid panel      Relevant Medications   EPINEPHrine 0.3 mg/0.3 mL IJ SOAJ injection   Other Relevant Orders   Lipid Profile    Return in about 3 months (around 07/16/2022) for f/u .    Miranda Killian, MD

## 2022-04-15 NOTE — Assessment & Plan Note (Signed)
Miranda Lopez was able to see neurosurgery for very small meningioma and posterior fossa findings. I am unable to see their records, however she states they were not concerned and noted findings were normal. We have requested records today for review. Unclear if repeat imaging to monitor is warranted, will f/u records request.

## 2022-04-15 NOTE — Patient Instructions (Signed)
It was wonderful to see you today!  Please ask your pharmacist about getting the shingles (Shingrix) vaccine and make sure the flu shot you got in June was for this year and not last!  Keep track of your blood pressure at home and let me know if it is consistently running higher than 130-140/80-90.  Have fun on your trip!

## 2022-04-15 NOTE — Assessment & Plan Note (Signed)
Refill of Epipen provided today as her supply will expire in 05/2022. Ms. Impastato notes very severe allergy to bees, wasps, hornets, etc. She has not had to use a pen in some time.

## 2022-04-15 NOTE — Assessment & Plan Note (Signed)
Completed cardiac PET scan per cardiology given CAC score. No evidence of ischemia or infarction noted on PET. Study was noted to be normal and low risk. Moderate coronary artery calcifications were present. No signs/symptoms of ischemic heart disease at this time.

## 2022-04-15 NOTE — Progress Notes (Signed)
ROI form signed per pt and faxed to [facility] for records.

## 2022-04-16 ENCOUNTER — Encounter: Payer: Self-pay | Admitting: Student

## 2022-04-16 LAB — LIPID PANEL
Chol/HDL Ratio: 2.8 ratio (ref 0.0–4.4)
Cholesterol, Total: 219 mg/dL — ABNORMAL HIGH (ref 100–199)
HDL: 79 mg/dL (ref >39)
LDL Chol Calc (NIH): 124 mg/dL — ABNORMAL HIGH (ref 0–99)
Triglycerides: 90 mg/dL (ref 0–149)
VLDL Cholesterol Cal: 16 mg/dL (ref 5–40)

## 2022-04-16 NOTE — Progress Notes (Signed)
Increase in total and LDL cholesterol since off statin. ASCVD risk 10.3%. Miranda Lopez and I discussed options via telephone and she elects to attempt to lose her final goal of 10 more pounds in the next 3 months and repeat levels at that time which I feel is reasonable. Encouraged continued healthy diet and exercise.

## 2022-04-16 NOTE — Telephone Encounter (Signed)
Discussed these concerns with Miranda Lopez over the telephone when reviewing lab results. Encouraged her that some discoloration can be normal after blood draw but to keep an eye on the area and let us know if it is significantly increasing further, becoming red, painful or otherwise worrisome.

## 2022-04-17 ENCOUNTER — Encounter: Payer: Self-pay | Admitting: Internal Medicine

## 2022-04-17 DIAGNOSIS — Z91030 Bee allergy status: Secondary | ICD-10-CM

## 2022-05-24 MED ORDER — EPINEPHRINE 0.3 MG/0.3ML IJ SOAJ: 0.3000 mg | INTRAMUSCULAR | 0 refills | Status: AC | PRN

## 2022-06-11 ENCOUNTER — Other Ambulatory Visit: Payer: Self-pay | Admitting: Internal Medicine

## 2022-06-11 DIAGNOSIS — B351 Tinea unguium: Secondary | ICD-10-CM

## 2022-06-11 MED ORDER — CICLOPIROX 8 % EX SOLN: Freq: Every day | CUTANEOUS | 0 refills | Status: DC

## 2022-06-13 ENCOUNTER — Telehealth: Payer: Self-pay

## 2022-06-13 NOTE — Telephone Encounter (Signed)
I have received  a PA for pt ( CICLOPIROX )  as I am completing the  PA a few questions have popped up that  allows me to know some of the requirements  and the patient has not tried or have be tried on  these from the messages to PCP from patient .Marland Kitchen So if it is denied here are some of the things needing to have been tried first  ....    Has the member had an inadequate response, intolerance or contraindication(s) to oral terbinafine used for at least 12 weeks?  Yes (please specify corresponding contraindication(s) or intolerance experienced and the start and end date(s) of therapy (month/year))  No (please provide clinical rationale for the request) No (please provide clinical rationale for the request)      Has the diagnosis of onychomycosis been confirmed by one of the following?  Positive potassium hydroxide (KOH) preparation (please specify lab values and date of lab test (month/year))  Culture (please specify lab values and date of lab test (month/year))  Histology (please specify lab values and date of lab test (month/year))  No (please specify lab test, lab values and date of lab test (month/year)) No (please specify lab test, lab values and date of lab test (month/year))    Does the member have lunula (matrix) involvement?  Yes (please provide clinical rationale for the request)

## 2022-06-13 NOTE — Telephone Encounter (Signed)
PA  was submitted anyways ... Awaiting approval or denial

## 2022-06-19 NOTE — Telephone Encounter (Signed)
UPDATE : out come is  still in process .Marland Kitchen It may be because they patients has not tried the other meds or the test they require  to be done was not preformed either

## 2022-06-20 ENCOUNTER — Other Ambulatory Visit: Payer: Self-pay | Admitting: Internal Medicine

## 2022-06-20 DIAGNOSIS — B351 Tinea unguium: Secondary | ICD-10-CM

## 2022-06-20 MED ORDER — CICLOPIROX 8 % EX SOLN: Freq: Every day | CUTANEOUS | 0 refills | Status: DC

## 2022-06-20 NOTE — Telephone Encounter (Signed)
Discussed PA limitations with Ms. Miranda Lopez 06/20/22. She continues to wish to avoid potential side effects of systemic therapy with oral terbinafine to which I agree. She would like to go forward with trying topical ciclopirox and will use GoodRx coupon to obtain the medication from the pharmacy. We again discussed limitations of therapy for onychomycosis to which she is understanding. No further questions.

## 2022-06-25 NOTE — Telephone Encounter (Signed)
Received My Chart message from pt stating the pharmacy had not received [place] rx. I  talked to the pharmacist at [facility], rx was recived. Cost $90.00 w/o a PA being done and with their discount card $54.89 - My Chart message sent to pt also.

## 2022-09-19 ENCOUNTER — Encounter: Payer: Self-pay | Admitting: Student

## 2022-09-30 ENCOUNTER — Other Ambulatory Visit: Payer: Self-pay | Admitting: Internal Medicine

## 2022-09-30 DIAGNOSIS — Z1231 Encounter for screening mammogram for malignant neoplasm of breast: Secondary | ICD-10-CM

## 2022-10-07 ENCOUNTER — Ambulatory Visit (INDEPENDENT_AMBULATORY_CARE_PROVIDER_SITE_OTHER): Payer: PPO

## 2022-10-07 ENCOUNTER — Other Ambulatory Visit: Payer: Self-pay

## 2022-10-07 ENCOUNTER — Ambulatory Visit (INDEPENDENT_AMBULATORY_CARE_PROVIDER_SITE_OTHER): Payer: PPO | Admitting: Internal Medicine

## 2022-10-07 ENCOUNTER — Encounter: Payer: Self-pay | Admitting: Internal Medicine

## 2022-10-07 VITALS — BP 138/77 | HR 66 | Temp 97.9°F | Ht 67.0 in | Wt 194.9 lb

## 2022-10-07 VITALS — BP 159/86 | HR 68 | Temp 97.9°F | Ht 67.0 in | Wt 194.9 lb

## 2022-10-07 DIAGNOSIS — I77810 Thoracic aortic ectasia: Secondary | ICD-10-CM | POA: Diagnosis not present

## 2022-10-07 DIAGNOSIS — E785 Hyperlipidemia, unspecified: Secondary | ICD-10-CM

## 2022-10-07 DIAGNOSIS — I10 Essential (primary) hypertension: Secondary | ICD-10-CM | POA: Diagnosis not present

## 2022-10-07 DIAGNOSIS — L570 Actinic keratosis: Secondary | ICD-10-CM

## 2022-10-07 DIAGNOSIS — E669 Obesity, unspecified: Secondary | ICD-10-CM | POA: Diagnosis not present

## 2022-10-07 DIAGNOSIS — Z0000 Encounter for general adult medical examination without abnormal findings: Secondary | ICD-10-CM | POA: Diagnosis not present

## 2022-10-07 DIAGNOSIS — R7303 Prediabetes: Secondary | ICD-10-CM | POA: Diagnosis not present

## 2022-10-07 DIAGNOSIS — D329 Benign neoplasm of meninges, unspecified: Secondary | ICD-10-CM | POA: Diagnosis not present

## 2022-10-07 DIAGNOSIS — Z6830 Body mass index (BMI) 30.0-30.9, adult: Secondary | ICD-10-CM | POA: Diagnosis not present

## 2022-10-07 HISTORY — DX: Actinic keratosis: L57.0

## 2022-10-07 LAB — GLUCOSE, CAPILLARY: Glucose-Capillary: 88 mg/dL (ref 70–99)

## 2022-10-07 LAB — POCT GLYCOSYLATED HEMOGLOBIN (HGB A1C): Hemoglobin A1C: 5.5 % (ref 4.0–5.6)

## 2022-10-07 NOTE — Assessment & Plan Note (Signed)
Small, erythematous papule with overlying scale over right forehead. Appears consistent with an AK. Given sensitive area, I have referred Miranda Lopez to dermatology for therapeutic treatment as well as full skin exam given history of significant sun exposure.

## 2022-10-07 NOTE — Progress Notes (Unsigned)
Subjective:   Miranda Lopez is a 67 y.o. female who presents for an Initial Medicare Annual Wellness Visit. I connected with  Gordy Clement on 10/07/22 by a  Face-To-Face  enabled telemedicine application and verified that I am speaking with the correct person using two identifiers.  Patient Location: Other:  Office/Clinic  Provider Location: Office/Clinic  I discussed the limitations of evaluation and management by telemedicine. The patient expressed understanding and agreed to proceed.  Review of Systems    Defer to PCP       Objective:    Today's Vitals   10/07/22 1131 10/07/22 1132 10/07/22 1514  BP: (!) 156/82 138/77 (!) 159/86  Pulse: 67  68  Temp:   97.9 F (36.6 C)  TempSrc:   Oral  SpO2:   100%  Weight:   194 lb 14.4 oz (88.4 kg)  Height:   5' 7"$  (1.702 m)   Body mass index is 30.53 kg/m.     10/07/2022    3:21 PM 10/07/2022   11:08 AM 04/15/2022    9:21 AM 12/31/2021    9:18 AM 11/12/2021   10:27 AM 11/07/2021    8:48 AM 10/15/2021   10:54 AM  Advanced Directives  Does Patient Have a Medical Advance Directive? Yes Yes Yes Yes Yes Yes Yes  Type of Paramedic of [place];Living will [place];Living will [place];Living will [place];Living will [place];Living will [place];Living will;Out of facility DNR (pink MOST or yellow form) [place];Living will  Does patient want to make changes to medical advance directive?  No - Patient declined No - Patient declined No - Patient declined No - Patient declined  No - Patient declined  Copy of [place] in Chart? No - copy requested No - copy requested No - copy requested No - copy requested No - copy requested  No - copy requested    Current Medications (verified) Outpatient Encounter Medications as of 10/07/2022  Medication Sig   ciclopirox  (PENLAC) 8 % solution Apply topically at bedtime. Apply over nail and surrounding skin. Apply daily over previous coat. After seven (7) days, may remove with alcohol and continue cycle.   EPINEPHrine 0.3 mg/0.3 mL IJ SOAJ injection Inject 0.3 mg into the muscle as needed for anaphylaxis.   No facility-administered encounter medications on file as of 10/07/2022.    Allergies (verified) Bee venom, Propofol, and Robinul [glycopyrrolate]   History: Past Medical History:  Diagnosis Date   Cancer ([place])    cervical cancer   Contusion of head, subsequent encounter 11/29/2020   COVID-19    patient reported 07/2018   Diverticulitis    last epiosde 10-11 yrs ago   Eclampsia    Palpitations 10/15/2021   Poison ivy 01/27/2022   Screening for colon cancer 11/29/2020   Hx of adenomatous colonic polyps   Vertigo 11/08/2021   Past Surgical History:  Procedure Laterality Date   cervix removed for cervical cancer     CESAREAN SECTION     x2   COLONOSCOPY     HERNIA REPAIR     age 86   POLYPECTOMY     Family History  Problem Relation Age of Onset   Heart failure Father 21   CAD Father 41       Heart attack and quadruple bypass at age 51.   Colon cancer Neg Hx  Colon polyps Neg Hx    Esophageal cancer Neg Hx    Rectal cancer Neg Hx    Stomach cancer Neg Hx    Social History   Socioeconomic History   Marital status: Single    Spouse name: Not on file   Number of children: Not on file   Years of education: Not on file   Highest education level: Not on file  Occupational History   Not on file  Tobacco Use   Smoking status: Never   Smokeless tobacco: Never  Substance and Sexual Activity   Alcohol use: Yes    Alcohol/week: 7.0 standard drinks of alcohol    Types: 7 Glasses of wine per week    Comment: wine with dinner nightly   Drug use: Not on file   Sexual activity: Not on file  Other Topics Concern   Not on file  Social History Narrative   Currently working, owns two  businesses.   Social Determinants of Health   Financial Resource Strain: Low Risk  (10/07/2022)   Overall Financial Resource Strain (CARDIA)    Difficulty of Paying Living Expenses: Not hard at all  Food Insecurity: No Food Insecurity (10/07/2022)   Hunger Vital Sign    Worried About Running Out of Food in the Last Year: Never true    Ran Out of Food in the Last Year: Never true  Transportation Needs: No Transportation Needs (10/07/2022)   PRAPARE - Hydrologist (Medical): No    Lack of Transportation (Non-Medical): No  Physical Activity: Unknown (10/07/2022)   Exercise Vital Sign    Days of Exercise per Week: Patient refused    Minutes of Exercise per Session: Patient refused  Stress: Unknown (10/07/2022)   [place]    Feeling of Stress : Patient refused  Social Connections: Unknown (10/07/2022)   Social Connection and Isolation Panel [NHANES]    Frequency of Communication with Friends and Family: Patient refused    Frequency of Social Gatherings with Friends and Family: Patient refused    Attends Religious Services: Patient refused    Marine scientist or Organizations: Patient refused    Attends Music therapist: Patient refused    Marital Status: Patient refused    Tobacco Counseling Counseling given: Not Answered   Clinical Intake:  Pre-visit preparation completed: Yes  Pain : No/denies pain     Nutritional Risks: None Diabetes: Yes CBG done?: Yes CBG resulted in Enter/ Edit results?: Yes Did pt. bring in CBG monitor from home?: Yes Glucose Meter Downloaded?: Yes  How often do you need to have someone help you when you read instructions, pamphlets, or other written materials from your doctor or pharmacy?: 1 - Never What is the last grade level you completed in school?: college  Diabetic?No  Interpreter Needed?: No  Information entered by :: Miranda Lopez,cma 10/07/22 3:18pm   Activities of Daily Living    10/07/2022   11:08 AM 04/15/2022    9:21 AM  In your present state of health, do you have any difficulty performing the following activities:  Hearing? 0 0  Vision? 0 0  Difficulty concentrating or making decisions? 0 0  Walking or climbing stairs? 0 0  Dressing or bathing? 0 0  Doing errands, shopping? 0 0    Patient Care Team: Charise Killian, MD as PCP - General Josue Hector, MD as PCP - Cardiology (Cardiology)  Indicate any  recent Medical Services you may have received from other than [facility] providers in the past year (date may be approximate).     Assessment:   This is a routine wellness examination for Miranda Lopez.  Hearing/Vision screen No results found.  Dietary issues and exercise activities discussed:     Goals Addressed   None   Depression Screen    10/07/2022    3:22 PM 10/07/2022   10:58 AM 04/15/2022    9:21 AM 12/31/2021    9:18 AM 11/12/2021   10:27 AM 11/07/2021    8:47 AM 10/15/2021    1:46 PM  PHQ 2/9 Scores  PHQ - 2 Score 0 0 0 0 0 0 0  PHQ- 9 Score      0 0    Fall Risk    10/07/2022    3:22 PM 10/07/2022   10:55 AM 04/15/2022    9:20 AM 12/31/2021    9:18 AM 11/12/2021   10:26 AM  Fall Risk   Falls in the past year? 0 0 0 0 0  Number falls in past yr: 0 0 0 0 0  Injury with Fall? 0 0 0 0 0  Risk for fall due to : No Fall Risks No Fall Risks No Fall Risks No Fall Risks No Fall Risks  Follow up Falls evaluation completed;Falls prevention discussed  Falls evaluation completed;Falls prevention discussed Falls evaluation completed;Falls prevention discussed Falls evaluation completed;Follow up appointment    FALL RISK PREVENTION PERTAINING TO THE HOME:  Any stairs in or around the home?  Patient refused If so, are there any without handrails? Patient refused Home free of loose throw rugs in walkways, pet beds, electrical cords, etc? Patient refused Adequate lighting in your home to reduce risk  of falls? Patient refused  ASSISTIVE DEVICES UTILIZED TO PREVENT FALLS:  Life alert? Patient refused Use of a cane, walker or w/c? Patient refused Grab bars in the bathroom? Patient refused Shower chair or bench in shower? Patient refused Elevated toilet seat or a handicapped toilet? Patient refused  TIMED UP AND GO:  Was the test performed? Yes .  Length of time to ambulate 10 feet: 1 min.   Gait steady and fast without use of assistive device  Cognitive Function:        10/07/2022    3:22 PM  6CIT Screen  What Year? 0 points  What month? 0 points  What time? 0 points  Count back from 20 0 points  Months in reverse 0 points  Repeat phrase 0 points  Total Score 0 points    Immunizations Immunization History  Administered Date(s) Administered   COVID-19, mRNA, vaccine(Comirnaty)12 years and older 06/28/2022   Influenza, High Dose Seasonal PF 05/03/2022   Influenza-Unspecified 05/13/2021   PFIZER Comirnaty(Gray Top)Covid-19 Tri-Sucrose Vaccine 10/02/2019   PFIZER(Purple Top)SARS-COV-2 Vaccination 10/04/2019, 04/24/2020   Pfizer Covid-19 Vaccine Bivalent Booster 64yr & up 05/13/2021, 02/05/2022   Pneumococcal Conjugate-13 11/19/2019   Pneumococcal Polysaccharide-23 10/15/2018   Tdap 11/14/2021   Zoster Recombinat (Shingrix) 05/03/2022    TDAP status: Up to date  Flu Vaccine status: Up to date  Pneumococcal vaccine status: Up to date  Covid-19 vaccine status: Completed vaccines  Qualifies for Shingles Vaccine? No   Zostavax completed No   Shingrix Completed?: Yes  Screening Tests Health Maintenance  Topic Date Due   Zoster Vaccines- Shingrix (2 of 2) 06/28/2022   PAP SMEAR-Modifier  11/13/2022   Medicare Annual Wellness (AWV)  10/08/2023   MAMMOGRAM  10/18/2023  COLONOSCOPY (Pts 45-28yr Insurance coverage will need to be confirmed)  03/13/2024   DTaP/Tdap/Td (2 - Td or Tdap) 11/15/2031   Pneumonia Vaccine 69 Years old  Completed   INFLUENZA VACCINE   Completed   DEXA SCAN  Completed   Hepatitis C Screening  Completed   HPV VACCINES  Aged Out   COVID-19 Vaccine  Discontinued    Health Maintenance  Health Maintenance Due  Topic Date Due   Zoster Vaccines- Shingrix (2 of 2) 06/28/2022   PAP SMEAR-Modifier  11/13/2022    Colorectal cancer screening: Type of screening: Colonoscopy. Completed 03/13/2021. Repeat every 3 years  Mammogram status: Ordered scheduled for 11/05/2022. Pt provided with contact info and advised to call to schedule appt.     Lung Cancer Screening: (Low Dose CT Chest recommended if Age 69-80years, 30 pack-year currently smoking OR have quit w/in 15years.) does not qualify.   Lung Cancer Screening Referral: N/A  Additional Screening:  Hepatitis C Screening: does not qualify; Completed 08/25/2020  Vision Screening: Recommended annual ophthalmology exams for early detection of glaucoma and other disorders of the eye. Is the patient up to date with their annual eye exam?  Patient refused Who is the provider or what is the name of the office in which the patient attends annual eye exams? Patient refused If pt is not established with a provider, would they like to be referred to a provider to establish care? Patient refused.   Dental Screening: Recommended annual dental exams for proper oral hygiene  Community Resource Referral / Chronic Care Management: CRR required this visit?  No   CCM required this visit?  No      Plan:     I have personally reviewed and noted the following in the patient's chart:   Medical and social history Use of alcohol, tobacco or illicit drugs  Current medications and supplements including opioid prescriptions. Patient is not currently taking opioid prescriptions. Functional ability and status Nutritional status Physical activity Advanced directives List of other physicians Hospitalizations, surgeries, and ER visits in previous 12 months Vitals Screenings to include  cognitive, depression, and falls Referrals and appointments  In addition, I have reviewed and discussed with patient certain preventive protocols, quality metrics, and best practice recommendations. A written personalized care plan for preventive services as well as general preventive health recommendations were provided to patient.     JKerin Perna [facility]  10/07/2022   Nurse Notes: Face-To-Face Visit  Miranda Lopez, Thank you for taking time to come for your Medicare Wellness Visit. I appreciate your ongoing commitment to your health goals. Please review the following plan we discussed and let me know if I can assist you in the future.   These are the goals we discussed:  Goals   None     This is a list of the screening recommended for you and due dates:  Health Maintenance  Topic Date Due   Zoster (Shingles) Vaccine (2 of 2) 06/28/2022   Pap Smear  11/13/2022   Medicare Annual Wellness Visit  10/08/2023   Mammogram  10/18/2023   Colon Cancer Screening  03/13/2024   DTaP/Tdap/Td vaccine (2 - Td or Tdap) 11/15/2031   Pneumonia Vaccine  Completed   Flu Shot  Completed   DEXA scan (bone density measurement)  Completed   Hepatitis C Screening: USPSTF Recommendation to screen - Ages 169-79yo.  Completed   HPV Vaccine  Aged Out   COVID-19 Vaccine  Discontinued

## 2022-10-07 NOTE — Progress Notes (Signed)
Established Patient Office Visit  Subjective   Patient ID: Miranda Lopez, female    DOB: 02-Sep-1955  Age: 67 y.o. MRN: [id number]  Chief Complaint  Patient presents with   Check-up Visit    Miranda Lopez is a 67 yo person here for follow-up of chronic conditions including prediabetes, hypertension, hyperlipidemia, class 1 obesity, meningioma, and mild aortic root dilation. Please see assessment/plan in problem-based charting for further details of today's visit.    Patient Active Problem List   Diagnosis Date Noted   Actinic keratosis of forehead 10/07/2022   Aortic root dilation ([place]) 10/07/2022   Meningioma ([place]) 12/31/2021   Elevated coronary artery calcium score 12/20/2021   Vulvar cyst 11/12/2021   Prediabetes 10/15/2021   Cervical neoplasm 10/15/2021   Obesity (BMI 30-39.9) 10/15/2021   Mild mitral regurgitation 10/15/2021   Skin tag 10/15/2021   Hx of adenomatous polyp of colon 03/17/2021   Epidermal inclusion cyst 12/14/2020   Allergy to honey bee venom 11/29/2020   Hyperlipidemia 11/29/2020   Hypertension 11/29/2020      Objective:     BP 138/77 (BP Location: Left Arm, Patient Position: Sitting, Cuff Size: Normal) Comment (BP Location): Taken right and left arm per MD's request.  Pulse 66   Temp 97.9 F (36.6 C) (Oral)   Ht 5' 7"$  (1.702 m)   Wt 194 lb 14.4 oz (88.4 kg)   SpO2 100% Comment: RA  BMI 30.53 kg/m  BP Readings from Last 3 Encounters:  10/07/22 138/77  04/15/22 (!) 155/88  12/31/21 130/80   Wt Readings from Last 3 Encounters:  10/07/22 194 lb 14.4 oz (88.4 kg)  04/15/22 194 lb 11.2 oz (88.3 kg)  12/31/21 195 lb 12.8 oz (88.8 kg)      Physical Exam Constitutional:      General: She is not in acute distress.    Appearance: Normal appearance. She is not ill-appearing.  Neck:     Vascular: No carotid bruit.  Cardiovascular:     Rate and Rhythm: Normal rate and regular rhythm.     Heart sounds: Murmur (2/6 systolic murmur heard  at RUSB) heard.  Pulmonary:     Effort: Pulmonary effort is normal.  Musculoskeletal:        General: No swelling.  Skin:    Comments: Erythematous papule with overlying scale on right forehead, <1 cm  Neurological:     General: No focal deficit present.     Mental Status: She is alert.  Psychiatric:        Mood and Affect: Mood normal.        Behavior: Behavior normal.        Thought Content: Thought content normal.    Results for orders placed or performed in visit on 10/07/22  Glucose, capillary  Result Value Ref Range   Glucose-Capillary 88 70 - 99 mg/dL  POC Hbg A1C  Result Value Ref Range   Hemoglobin A1C 5.5 4.0 - 5.6 %   HbA1c POC (<> result, manual entry)     HbA1c, POC (prediabetic range)     HbA1c, POC (controlled diabetic range)       Assessment & Plan:   Problem List Items Addressed This Visit       Cardiovascular and Mediastinum   Hypertension - Primary    BP elevated to 159/86 on initial evaluation of right arm. During our conversation, Miranda Lopez noted that she only takes her BP in her left arm at home and typically sees SBP  in the 130s. She takes it in her left arm because she has noticed it runs higher in the right arm for the last couple of years. Repeat BP in the right arm 156/82, left arm 138/77. Radial pulses are symmetric. I reviewed MRA head/neck from 11/2021 which does not outline any significant stenosis of proximal vessels off the aortic arch arch or throughout the head/neck. I spoke with the reading radiologist today and no significant stenosis of the right or left subclavian is seen more distally.   Given other risk factors, there is concern for possible PAD/arterial stenosis causing this BP differential although would have to be more distal than prior images. I feel it is reasonable to obtain further imaging to define whether there is clinically significant stenosis to explain this differential and guide further management. Currently no evidence of  symptomatic upper extremity ischemia. Given the potential for falsely lowered BP readings in the left arm, which Miranda Lopez has been tracking and have been reported within/near goal, we have discussed that BP readings in the right arm should be followed and used for decision making. I have recommended we restart medication based on today's readings, however Miranda Lopez wishes to wait for results of imaging prior to starting therapy. We discussed the benefits and risk reduction of appropriate blood pressure control and my concerns about undertreatment based on her home readings and we have agreed to monitor home BP in the right arm for the next several days and I will check in later this week. Previously blood pressures were managed with amlodipine 10 mg, however Miranda Lopez stopped this medication in July 2023 after her home readings improved following weight loss. Home readings were still within goal at our last visit 03/2022, although clinic readings were elevated, and I am wondering if these home readings may have all been falsely low taken in the left arm.  Plan -Measure home BP in the right arm, f/u later this week -Vascular arterial duplex left arm      Relevant Orders   VAS [place] UPPER EXTREMITY ARTERIAL DUPLEX   Aortic root dilation ([facility])    Mild aortic root dilation of 3.9 cm seen on TTE 10/2021. Repeat in one year planned 10/28/2022. We discussed blood pressure control for reduced risk of worsening, please see hypertension for further details.        Nervous and Auditory   Meningioma [facility])    Previously saw neurosurgery for incidental meningioma seen on MRI in 2023. Neurosurgery recommending f/u MRI in one year and if there is no significant growth, can repeat in two years. Will f/u with referral coordinator for scheduling.        Musculoskeletal and Integument   Actinic keratosis of forehead    Small, erythematous papule with overlying scale over right forehead. Appears consistent  with an AK. Given sensitive area, I have referred Miranda Lopez to dermatology for therapeutic treatment as well as full skin exam given history of significant sun exposure.       Relevant Orders   Ambulatory referral to Dermatology     Other   Hyperlipidemia    Will recheck lipid panel today as Ms. Hearne has been off her statin since July 2023. She previously chose to continue lifestyle changes for further management, however we will discuss resuming statin therapy if risk remains elevated.  Plan -Lipid panel      Relevant Orders   Lipid Profile   Prediabetes    A1c improved to normal range today, 5.5%.  Continue healthy lifestyle changes.      Relevant Orders   POC Hbg A1C (Completed)   Obesity (BMI 30-39.9)    Ms. Mccoll has maintained weight loss of around 15 pounds since February 2023 with nutrition changes and physical activity. She is motivated to maintain these changes and feels significantly better after losing weight. She would like to lose about 15 more pounds and has continued to work on nutrition. She is tracking her calories, intake, and energy output in an app that has been useful. We discussed adaptive metabolism and the potential for weight plateaus with decreasing calorie intake. I have provided her with whole-food, plant-based diet information that she may try to incorporate.        Return in about 11 weeks (around 12/23/2022) for Pap.    Charise Killian, MD

## 2022-10-07 NOTE — Assessment & Plan Note (Signed)
Mild aortic root dilation of 3.9 cm seen on TTE 10/2021. Repeat in one year planned 10/28/2022. We discussed blood pressure control for reduced risk of worsening, please see hypertension for further details.

## 2022-10-07 NOTE — Assessment & Plan Note (Addendum)
BP elevated to 159/86 on initial evaluation of right arm. During our conversation, Miranda Lopez noted that she only takes her BP in her left arm at home and typically sees SBP in the 130s. She takes it in her left arm because she has noticed it runs higher in the right arm for the last couple of years. Repeat BP in the right arm 156/82, left arm 138/77. Radial pulses are symmetric. I reviewed MRA head/neck from 11/2021 which does not outline any significant stenosis of proximal vessels off the aortic arch arch or throughout the head/neck. I spoke with the reading radiologist today and no significant stenosis of the right or left subclavian is seen more distally.   Given other risk factors, there is concern for possible PAD/arterial stenosis causing this BP differential although would have to be more distal than prior images. I feel it is reasonable to obtain further imaging to define whether there is clinically significant stenosis to explain this differential and guide further management. Currently no evidence of symptomatic upper extremity ischemia. Given the potential for falsely lowered BP readings in the left arm, which Miranda Lopez has been tracking and have been reported within/near goal, we have discussed that BP readings in the right arm should be followed and used for decision making. I have recommended we restart medication based on today's readings, however Miranda Lopez wishes to wait for results of imaging prior to starting therapy. We discussed the benefits and risk reduction of appropriate blood pressure control and my concerns about undertreatment based on her home readings and we have agreed to monitor home BP in the right arm for the next several days and I will check in later this week. Previously blood pressures were managed with amlodipine 10 mg, however Miranda Lopez stopped this medication in July 2023 after her home readings improved following weight loss. Home readings were still within  goal at our last visit 03/2022, although clinic readings were elevated, and I am wondering if these home readings may have all been falsely low taken in the left arm.  Plan -Measure home BP in the right arm, f/u later this week -Vascular arterial duplex left arm  Addendum 2/14 Miranda Lopez has been measuring home BP and noting elevated BP in both arms, 150/85. She actually has noticed now that left arm is up to 5 points higher than right, no episodes of lower BP in left arm as previously noted. Possible erroneous value noted on our clinic exam. Given elevation at home and in clinic, we discussed resuming amlodipine 10 mg daily which well managed her BP before. Following review of MRA without evidence of subclavian stenosis and now with fairly equal BP in both arms, I do not feel we need to pursue further left arm arterial evaluation at this time. I have encouraged her to continue monitoring and let me know if she notices a significant differential. Plan -Start amlodipine 10 mg daily -Cancel vascular arterial duplex -F/u next appointment

## 2022-10-07 NOTE — Assessment & Plan Note (Signed)
Will recheck lipid panel today as Ms. Miranda Lopez has been off her statin since July 2023. She previously chose to continue lifestyle changes for further management, however we will discuss resuming statin therapy if risk remains elevated.  Plan -Lipid panel  Addendum 2/14: ASCVD risk 15.7% with current lipid panel and BP. Previously elevated CAC score. We discussed recommendation for statin therapy and she was amenable to resuming rosuvastatin 10 mg daily. We also discussed consideration of aspirin therapy given elevated CVD risk, however she will think about this for now.

## 2022-10-07 NOTE — Assessment & Plan Note (Signed)
Previously saw neurosurgery for incidental meningioma seen on MRI in 2023. Neurosurgery recommending f/u MRI in one year and if there is no significant growth, can repeat in two years. Will f/u with referral coordinator for scheduling.

## 2022-10-07 NOTE — Assessment & Plan Note (Signed)
A1c improved to normal range today, 5.5%. Continue healthy lifestyle changes.

## 2022-10-07 NOTE — Assessment & Plan Note (Signed)
Miranda Lopez has maintained weight loss of around 15 pounds since February 2023 with nutrition changes and physical activity. She is motivated to maintain these changes and feels significantly better after losing weight. She would like to lose about 15 more pounds and has continued to work on nutrition. She is tracking her calories, intake, and energy output in an app that has been useful. We discussed adaptive metabolism and the potential for weight plateaus with decreasing calorie intake. I have provided her with whole-food, plant-based diet information that she may try to incorporate.

## 2022-10-07 NOTE — Patient Instructions (Addendum)
Please bring your blood pressure cuff to your next appointment in April! I would like to review the readings and compare it to our cuff again.  Some options to think about for further nutrition improvement: -Food as Medicine Jumpstart vs Whole Food Plant Based Meal Plan  -Full Plate Living ([email or web address]): A free membership program focused on high-fiber nutrition to improve energy, lower cholesterol, better blood sugar, and weight loss.

## 2022-10-08 LAB — LIPID PANEL
Chol/HDL Ratio: 2.5 ratio (ref 0.0–4.4)
Cholesterol, Total: 209 mg/dL — ABNORMAL HIGH (ref 100–199)
HDL: 85 mg/dL (ref >39)
LDL Chol Calc (NIH): 108 mg/dL — ABNORMAL HIGH (ref 0–99)
Triglycerides: 89 mg/dL (ref 0–149)
VLDL Cholesterol Cal: 16 mg/dL (ref 5–40)

## 2022-10-08 NOTE — Addendum Note (Signed)
Addended by: Charise Killian on: 10/08/2022 10:17 AM   Modules accepted: Orders

## 2022-10-09 MED ORDER — AMLODIPINE BESYLATE 10 MG PO TABS: 10.0000 mg | ORAL_TABLET | Freq: Every day | ORAL | 11 refills | Status: DC

## 2022-10-09 NOTE — Addendum Note (Signed)
Addended by: Charise Killian on: 10/09/2022 03:10 PM   Modules accepted: Orders

## 2022-10-09 NOTE — Addendum Note (Signed)
Addended by: Charise Killian on: 10/09/2022 12:05 PM   Modules accepted: Orders

## 2022-10-22 NOTE — Addendum Note (Signed)
Addended by: Charise Killian on: 10/22/2022 11:53 AM   Modules accepted: Orders

## 2022-10-28 ENCOUNTER — Ambulatory Visit (HOSPITAL_COMMUNITY): Payer: PPO | Attending: Cardiovascular Disease

## 2022-10-28 ENCOUNTER — Ambulatory Visit (HOSPITAL_COMMUNITY): Payer: PPO

## 2022-10-28 DIAGNOSIS — R002 Palpitations: Secondary | ICD-10-CM | POA: Insufficient documentation

## 2022-10-28 DIAGNOSIS — R011 Cardiac murmur, unspecified: Secondary | ICD-10-CM | POA: Diagnosis not present

## 2022-10-28 LAB — ECHOCARDIOGRAM COMPLETE
Area-P 1/2: 2.92 cm2
S' Lateral: 2.7 cm

## 2022-11-04 ENCOUNTER — Telehealth: Payer: Self-pay | Admitting: Internal Medicine

## 2022-11-04 NOTE — Telephone Encounter (Signed)
Reviewed TTE results, all questions answered.

## 2022-11-05 ENCOUNTER — Ambulatory Visit: Payer: PPO

## 2022-11-07 DIAGNOSIS — D225 Melanocytic nevi of trunk: Secondary | ICD-10-CM | POA: Diagnosis not present

## 2022-11-07 DIAGNOSIS — L821 Other seborrheic keratosis: Secondary | ICD-10-CM | POA: Diagnosis not present

## 2022-11-12 ENCOUNTER — Ambulatory Visit
Admission: RE | Admit: 2022-11-12 | Discharge: 2022-11-12 | Disposition: A | Discharge status: Home / Self Care | Payer: PPO | Source: Ambulatory Visit | Attending: Internal Medicine | Admitting: Internal Medicine

## 2022-11-12 DIAGNOSIS — Z1231 Encounter for screening mammogram for malignant neoplasm of breast: Secondary | ICD-10-CM

## 2022-11-26 ENCOUNTER — Ambulatory Visit (HOSPITAL_COMMUNITY)
Admission: RE | Admit: 2022-11-26 | Discharge: 2022-11-26 | Disposition: A | Discharge status: Home / Self Care | Payer: PPO | Source: Ambulatory Visit | Attending: Internal Medicine | Admitting: Internal Medicine

## 2022-11-26 DIAGNOSIS — D329 Benign neoplasm of meninges, unspecified: Secondary | ICD-10-CM | POA: Diagnosis not present

## 2022-11-26 DIAGNOSIS — D320 Benign neoplasm of cerebral meninges: Secondary | ICD-10-CM | POA: Diagnosis not present

## 2022-11-26 MED ORDER — GADOBUTROL 1 MMOL/ML IV SOLN
9.0000 mL | Freq: Once | INTRAVENOUS | Status: AC | PRN
  Administered 2022-11-26: 9 mL via INTRAVENOUS

## 2022-11-29 NOTE — Progress Notes (Signed)
Stable dural thickening c/w meningioma. Repeat MRI in 2 years per neurosurgery recommendations.

## 2022-12-30 ENCOUNTER — Encounter: Payer: Self-pay | Admitting: Internal Medicine

## 2022-12-30 ENCOUNTER — Other Ambulatory Visit (HOSPITAL_COMMUNITY)
Admission: RE | Admit: 2022-12-30 | Discharge: 2022-12-30 | Disposition: A | Discharge status: Home / Self Care | Payer: PPO | Source: Ambulatory Visit | Attending: Internal Medicine | Admitting: Internal Medicine

## 2022-12-30 ENCOUNTER — Ambulatory Visit (INDEPENDENT_AMBULATORY_CARE_PROVIDER_SITE_OTHER): Payer: PPO | Admitting: Internal Medicine

## 2022-12-30 ENCOUNTER — Other Ambulatory Visit: Payer: Self-pay

## 2022-12-30 VITALS — BP 138/74 | HR 67 | Temp 98.1°F | Ht 67.0 in | Wt 191.6 lb

## 2022-12-30 DIAGNOSIS — I10 Essential (primary) hypertension: Secondary | ICD-10-CM | POA: Diagnosis not present

## 2022-12-30 DIAGNOSIS — D4959 Neoplasm of unspecified behavior of other genitourinary organ: Secondary | ICD-10-CM

## 2022-12-30 DIAGNOSIS — Z1151 Encounter for screening for human papillomavirus (HPV): Secondary | ICD-10-CM | POA: Insufficient documentation

## 2022-12-30 DIAGNOSIS — L570 Actinic keratosis: Secondary | ICD-10-CM | POA: Diagnosis not present

## 2022-12-30 DIAGNOSIS — I77810 Thoracic aortic ectasia: Secondary | ICD-10-CM

## 2022-12-30 DIAGNOSIS — M25561 Pain in right knee: Secondary | ICD-10-CM

## 2022-12-30 DIAGNOSIS — Z124 Encounter for screening for malignant neoplasm of cervix: Secondary | ICD-10-CM | POA: Insufficient documentation

## 2022-12-30 DIAGNOSIS — D329 Benign neoplasm of meninges, unspecified: Secondary | ICD-10-CM | POA: Diagnosis not present

## 2022-12-30 DIAGNOSIS — N907 Vulvar cyst: Secondary | ICD-10-CM

## 2022-12-30 DIAGNOSIS — G8929 Other chronic pain: Secondary | ICD-10-CM

## 2022-12-30 DIAGNOSIS — R931 Abnormal findings on diagnostic imaging of heart and coronary circulation: Secondary | ICD-10-CM | POA: Diagnosis not present

## 2022-12-30 NOTE — Assessment & Plan Note (Signed)
Repeat TTE 10/2022 with normal size/structure of the aortic root noted.

## 2022-12-30 NOTE — Assessment & Plan Note (Signed)
MRI 11/2022 with stable findings. Repeat in two years per prior neurosurgery recommendations.

## 2022-12-30 NOTE — Assessment & Plan Note (Signed)
BP improved on amlodipine 10 mg daily. Home BP readings typically 120-130s in both arms. Today 129/73 in the right arm and 138/74 in the left arm. No AE effects. Continue current regimen.

## 2022-12-30 NOTE — Assessment & Plan Note (Signed)
No changes in previously noted flesh-colored papule over right labia majora. Asymptomatic. Appears consistent with benign cyst.

## 2022-12-30 NOTE — Patient Instructions (Addendum)
It was wonderful to see you today!  I will update you with the Pap results when they are available.  For your knee, I have sent a referral to sports medicine.

## 2022-12-30 NOTE — Assessment & Plan Note (Signed)
Repeat cytology with HPV testing today for history of cervical dysplasia vs neoplasm.

## 2022-12-30 NOTE — Progress Notes (Signed)
Established Patient Office Visit  Subjective   Patient ID: Miranda Lopez, female    DOB: 03-Jun-1956  Age: 67 y.o. MRN: [id number]  Chief Complaint  Patient presents with   Gynecologic Exam   Right knee pain    Miranda Lopez returns to clinic for BP f/u and cervical cancer screening, along with f/u of chronic condition. Please see assessment/plan in problem-based charting for further details of today's visit.    Patient Active Problem List   Diagnosis Date Noted   Chronic pain of right knee 12/30/2022   Actinic keratosis of forehead 10/07/2022   Aortic root dilation (HCC) 10/07/2022   Meningioma (HCC) 12/31/2021   Elevated coronary artery calcium score 12/20/2021   Vulvar cyst 11/12/2021   Prediabetes 10/15/2021   Cervical neoplasm 10/15/2021   Obesity (BMI 30-39.9) 10/15/2021   Mild mitral regurgitation 10/15/2021   Skin tag 10/15/2021   Hx of adenomatous polyp of colon 03/17/2021   Epidermal inclusion cyst 12/14/2020   Allergy to honey bee venom 11/29/2020   Hyperlipidemia 11/29/2020   Hypertension 11/29/2020      Objective:     BP 138/74 (BP Location: Left Arm, Patient Position: Sitting, Cuff Size: Normal)   Pulse 67   Temp 98.1 F (36.7 C) (Oral)   Ht 5\' 7"  (1.702 m)   Wt 191 lb 9.6 oz (86.9 kg)   SpO2 100% Comment: RA  BMI 30.01 kg/m  BP Readings from Last 3 Encounters:  12/30/22 138/74  10/07/22 (!) 159/86  10/07/22 138/77   Wt Readings from Last 3 Encounters:  12/30/22 191 lb 9.6 oz (86.9 kg)  10/07/22 194 lb 14.4 oz (88.4 kg)  10/07/22 194 lb 14.4 oz (88.4 kg)      Physical Exam Vitals reviewed. Exam conducted with a chaperone present.  Constitutional:      General: She is not in acute distress.    Appearance: Normal appearance.  Cardiovascular:     Pulses: Normal pulses.  Genitourinary:    Vagina: Normal.     Cervix: Friability present. No discharge or cervical bleeding.     Comments: 1 cm flesh-colored papule over right labia  majora Neurological:     General: No focal deficit present.     Mental Status: She is alert.  Psychiatric:        Mood and Affect: Mood normal.        Behavior: Behavior normal.       Assessment & Plan:   Problem List Items Addressed This Visit       Cardiovascular and Mediastinum   Hypertension    BP improved on amlodipine 10 mg daily. Home BP readings typically 120-130s in both arms. Today 129/73 in the right arm and 138/74 in the left arm. No AE effects. Continue current regimen.       Relevant Medications   aspirin 81 MG chewable tablet   Elevated coronary artery calcium score    With CAC >100 and >75th percentile, as well as age <76, we discussed starting statin therapy and aspirin for ASCVD prevention following last visit. Miranda Lopez has continued this therapy and is doing well. No changes today.      Relevant Medications   aspirin 81 MG chewable tablet   Aortic root dilation (HCC)    Repeat TTE 10/2022 with normal size/structure of the aortic root noted.      Relevant Medications   aspirin 81 MG chewable tablet     Nervous and Auditory   Meningioma (HCC)  MRI 11/2022 with stable findings. Repeat in two years per prior neurosurgery recommendations.        Musculoskeletal and Integument   Actinic keratosis of forehead    Miranda Lopez saw dermatology who did not feel previously noted areas were consistent with AK and did not recommend cryo. Continue to monitor.        Genitourinary   Cervical neoplasm    Repeat cytology with HPV testing today for history of cervical dysplasia vs neoplasm.       Relevant Orders   Cytology -Pap Smear   Vulvar cyst    No changes in previously noted flesh-colored papule over right labia majora. Asymptomatic. Appears consistent with benign cyst.         Other   Chronic pain of right knee - Primary    Miranda Lopez has noted chronic, intermittent right knee pain localized over the medial joint. She played soccer years ago  and now remains very active with hiking and golfing. Pain has not limited these activities. She feels that there is something "loose" in the knee and can hear it clicking. She can sometimes move it back in to place when she has pain which improves her symptoms. No significant locking. Some feelings of instability early in the morning. Negative McMurray, Thessaly. Normal stability, no laxity. No effusion, warmth, erythema, or joint line tenderness. Concern for mobile bone or cartilage fragment that may lead to intermittent symptoms. Encouraged referral to sports medicine for possible [place], further imaging if required.  Plan -Referral to sports medicine      Relevant Orders   Ambulatory referral to Sports Medicine    Return in about 3 months (around 04/01/2023) for fu .    Dickie La, MD

## 2022-12-30 NOTE — Assessment & Plan Note (Signed)
Miranda Lopez saw dermatology who did not feel previously noted areas were consistent with AK and did not recommend cryo. Continue to monitor.

## 2022-12-30 NOTE — Assessment & Plan Note (Signed)
With CAC >100 and >75th percentile, as well as age <70, we discussed starting statin therapy and aspirin for ASCVD prevention following last visit. Miranda Lopez has continued this therapy and is doing well. No changes today.

## 2022-12-30 NOTE — Assessment & Plan Note (Signed)
Miranda Lopez has noted chronic, intermittent right knee pain localized over the medial joint. She played soccer years ago and now remains very active with hiking and golfing. Pain has not limited these activities. She feels that there is something "loose" in the knee and can hear it clicking. She can sometimes move it back in to place when she has pain which improves her symptoms. No significant locking. Some feelings of instability early in the morning. Negative McMurray, Thessaly. Normal stability, no laxity. No effusion, warmth, erythema, or joint line tenderness. Concern for mobile bone or cartilage fragment that may lead to intermittent symptoms. Encouraged referral to sports medicine for possible [place], further imaging if required.  Plan -Referral to sports medicine

## 2023-01-01 LAB — CYTOLOGY - PAP
Comment: NEGATIVE
Diagnosis: NEGATIVE
High risk HPV: NEGATIVE

## 2023-01-01 NOTE — Progress Notes (Signed)
NILM, HPV negative. Reviewed with Ms. Tuohey. We discussed although the exact details of last diagnosis/treatment are unknown, we can likely space out screening. Will discuss again at next visit.

## 2023-01-07 ENCOUNTER — Ambulatory Visit: Payer: PPO | Admitting: Sports Medicine

## 2023-01-07 ENCOUNTER — Ambulatory Visit
Admission: RE | Admit: 2023-01-07 | Discharge: 2023-01-07 | Disposition: A | Discharge status: Home / Self Care | Payer: PPO | Source: Ambulatory Visit | Attending: Sports Medicine | Admitting: Sports Medicine

## 2023-01-07 VITALS — BP 126/80 | Ht 67.0 in | Wt 184.0 lb

## 2023-01-07 DIAGNOSIS — M25561 Pain in right knee: Secondary | ICD-10-CM | POA: Diagnosis not present

## 2023-01-07 DIAGNOSIS — G8929 Other chronic pain: Secondary | ICD-10-CM | POA: Diagnosis not present

## 2023-01-07 DIAGNOSIS — M150 Primary generalized (osteo)arthritis: Secondary | ICD-10-CM | POA: Diagnosis not present

## 2023-01-07 DIAGNOSIS — M25461 Effusion, right knee: Secondary | ICD-10-CM | POA: Diagnosis not present

## 2023-01-07 NOTE — Progress Notes (Signed)
   Subjective:    Patient ID: Miranda Lopez, female    DOB: Mar 11, 1956, 67 y.o.   MRN: [id number]  HPI chief complaint: Right knee pain  Patient is a very pleasant active 67 year old female that presents today complaining of 8 months of intermittent right knee pain.  She does not recall any specific trauma to the knee.  She describes episodes of catching in the knee which will require her to manipulate the knee in order to become pain-free.  She has some days where she is able to be as active as she would like with no pain.  Other times she will have pain that lasts for a couple of days.  Her symptoms do tend to respond to Advil and ice.  She has not noticed any swelling.  She localizes her pain to the medial knee.  She played both ice hockey as well as soccer previously.  She denies any remote injury to the right knee.  She does remember a left knee patellar dislocation that did not require surgery.  Left knee does not bother her.  She has not had any imaging.  Past medical history reviewed Medications reviewed Allergies reviewed   Review of Systems As above    Objective:   Physical Exam  Well-developed, well-nourished.  No acute distress  Right knee: Full range of motion.  No effusion.  2+ patellofemoral crepitus.  She is tender to palpation along the medial joint line with a positive Thessaly's.  Negative McMurray's.  No tenderness along the lateral joint line.  Knee is stable to valgus and varus stressing.  Negative anterior drawer, negative posterior drawer.  Neurovascularly intact distally.  Limited bedside ultrasound of the right knee shows no obvious effusion.  There are degenerative changes within the medial joint space.  There is some bulging of the medial meniscus here but I do not see any discrete tears through the periphery.      Assessment & Plan:   Left knee pain secondary to DJD versus loose body versus meniscal tear  Were going to start with getting an x-ray of  the left knee.  I will message her through MyChart once I have those results.  If advanced arthritis is seen, we may elect to start with a cortisone injection before further diagnostic imaging.  However, if arthritis is minimal and no obvious loose bodies are seen on x-ray, we may want to proceed with MRI to evaluate further.  I will discuss all of this with her once I have seen her x-rays.  This note was dictated using Dragon naturally speaking software and may contain errors in syntax, spelling, or content which have not been identified prior to signing this note.

## 2023-01-13 ENCOUNTER — Encounter: Payer: Self-pay | Admitting: Sports Medicine

## 2023-01-26 ENCOUNTER — Other Ambulatory Visit: Payer: Self-pay | Admitting: Internal Medicine

## 2023-04-01 ENCOUNTER — Encounter: Payer: Self-pay | Admitting: Internal Medicine

## 2023-04-01 ENCOUNTER — Ambulatory Visit (INDEPENDENT_AMBULATORY_CARE_PROVIDER_SITE_OTHER): Payer: PPO | Admitting: Internal Medicine

## 2023-04-01 ENCOUNTER — Other Ambulatory Visit: Payer: Self-pay

## 2023-04-01 VITALS — BP 121/79 | HR 73 | Temp 98.3°F | Ht 67.0 in | Wt 191.6 lb

## 2023-04-01 DIAGNOSIS — I340 Nonrheumatic mitral (valve) insufficiency: Secondary | ICD-10-CM

## 2023-04-01 DIAGNOSIS — I10 Essential (primary) hypertension: Secondary | ICD-10-CM

## 2023-04-01 DIAGNOSIS — D4959 Neoplasm of unspecified behavior of other genitourinary organ: Secondary | ICD-10-CM

## 2023-04-01 DIAGNOSIS — E785 Hyperlipidemia, unspecified: Secondary | ICD-10-CM | POA: Diagnosis not present

## 2023-04-01 DIAGNOSIS — G8929 Other chronic pain: Secondary | ICD-10-CM | POA: Diagnosis not present

## 2023-04-01 DIAGNOSIS — M25561 Pain in right knee: Secondary | ICD-10-CM | POA: Diagnosis not present

## 2023-04-01 DIAGNOSIS — R931 Abnormal findings on diagnostic imaging of heart and coronary circulation: Secondary | ICD-10-CM

## 2023-04-01 MED ORDER — AMLODIPINE BESYLATE 10 MG PO TABS: 5.0000 mg | ORAL_TABLET | Freq: Every day | ORAL | Status: DC

## 2023-04-01 NOTE — Assessment & Plan Note (Addendum)
Miranda Lopez's blood pressure today was 121/79, consistent with blood pressure readings at home. She denied any palpitations, chest pains, SOB or headaches. She is very active and has a healthy lifestyle. She is soon going on a hiking trip. She wants to be off her blood pressure medications. She is currently taking 10 mg amlodipine. Discussed with her on going down on the lowest dose and monitoring her blood pressures before completely stopping her medications.   Plan  - Decrease amlodipine to 5 mg (once she is back from her hiking trip Aug 22).  - telephone visit with Dr.Lau to discuss blood pressue once dose is decreased - continue monitoring bp at home

## 2023-04-01 NOTE — Progress Notes (Signed)
Attestation for Student Documentation:  I personally was present and re-performed the history, physical exam and medical decision-making activities of this service and have verified that the service and findings are accurately documented in the student's note.  Dickie La, MD 04/01/2023, 1:49 PM

## 2023-04-01 NOTE — Patient Instructions (Signed)
It was wonderful to see you!  Today, we discussed your blood pressure. You may try cutting your tablet in half to take 5 mg daily. Please measure your blood pressure daily for the next several weeks and keep a record. I will call you during the first couple of weeks in September to follow-up and discuss if we need to make further changes.  Please continue taking rosuvastatin and aspirin daily for cholesterol and heart protection.  Have fun on your trip!

## 2023-04-01 NOTE — Progress Notes (Signed)
The care of the patient was discussed with Dr. Sol Miranda Lopez and the assessment and plan was formulated with their assistance.  Please see their note for official documentation of the patient encounter.   Subjective:   Patient ID: Miranda Lopez female   DOB: [birth year] 67 y.o.   MRN: [id number]  HPI: [name] is a 67 y.o. w/ PMH of well-controlled HTN, and HLD presenting to clinic for a routine follow up. Ms. Miranda Lopez did not have any complaints today but was interested in getting off her blood pressure and cholesterol medications. Please see problem based assessment and plan for details of the visit.     Past Medical History:  Diagnosis Date   Actinic keratosis of forehead 10/07/2022   Cancer (HCC)    cervical cancer   Contusion of head, subsequent encounter 11/29/2020   COVID-19    patient reported 07/2018   Diverticulitis    last epiosde 10-11 yrs ago   Eclampsia    Palpitations 10/15/2021   Poison ivy 01/27/2022   Screening for colon cancer 11/29/2020   Hx of adenomatous colonic polyps   Vertigo 11/08/2021   Current Outpatient Medications  Medication Sig Dispense Refill   amLODipine (NORVASC) 10 MG tablet Take 0.5 tablets (5 mg total) by mouth daily.     aspirin 81 MG chewable tablet Chew 81 mg by mouth daily.     ciclopirox (PENLAC) 8 % solution Apply topically at bedtime. Apply over nail and surrounding skin. Apply daily over previous coat. After seven (7) days, may remove with alcohol and continue cycle. 6.6 mL 0   EPINEPHrine 0.3 mg/0.3 mL IJ SOAJ injection Inject 0.3 mg into the muscle as needed for anaphylaxis. 4 each 0   rosuvastatin (CRESTOR) 10 MG tablet TAKE 1 TABLET(10 MG) BY MOUTH DAILY 90 tablet 3   No current facility-administered medications for this visit.   Family History  Problem Relation Age of Onset   Heart failure Father 4   CAD Father 36       Heart attack and quadruple bypass at age 51.   Breast cancer Paternal Aunt    Colon cancer  Neg Hx    Colon polyps Neg Hx    Esophageal cancer Neg Hx    Rectal cancer Neg Hx    Stomach cancer Neg Hx    Social History   Socioeconomic History   Marital status: Single    Spouse name: Not on file   Number of children: Not on file   Years of education: Not on file   Highest education level: Not on file  Occupational History   Not on file  Tobacco Use   Smoking status: Never   Smokeless tobacco: Never  Substance and Sexual Activity   Alcohol use: Yes    Alcohol/week: 7.0 standard drinks of alcohol    Types: 7 Glasses of wine per week    Comment: wine with dinner nightly   Drug use: Not on file   Sexual activity: Not on file  Other Topics Concern   Not on file  Social History Narrative   Currently working, owns two businesses.   Social Determinants of Health   Financial Resource Strain: Low Risk  (10/07/2022)   Overall Financial Resource Strain (CARDIA)    Difficulty of Paying Living Expenses: Not hard at all  Food Insecurity: No Food Insecurity (10/07/2022)   Hunger Vital Sign    Worried About Running Out of Food in the Last Year: Never true  Ran Out of Food in the Last Year: Never true  Transportation Needs: No Transportation Needs (10/07/2022)   PRAPARE - Administrator, Civil Service (Medical): No    Lack of Transportation (Non-Medical): No  Physical Activity: Patient Declined (10/07/2022)   Exercise Vital Sign    Days of Exercise per Week: Patient declined    Minutes of Exercise per Session: Patient declined  Stress: Patient Declined (10/07/2022)   [employer] of Occupational Health - Occupational Stress Questionnaire    Feeling of Stress : Patient declined  Social Connections: Patient Declined (10/07/2022)   Social Connection and Isolation Panel [NHANES]    Frequency of Communication with Friends and Family: Patient declined    Frequency of Social Gatherings with Friends and Family: Patient declined    Attends Religious Services: Patient  declined    Database administrator or Organizations: Patient declined    Attends Banker Meetings: Patient declined    Marital Status: Patient declined   Review of Systems: ROS negative except for what is noted on the assessment and plan.  Objective:  Physical Exam: Vitals:   04/01/23 0954  BP: 121/79  Pulse: 73  Temp: 98.3 F (36.8 C)  SpO2: 100%  Weight: 191 lb 9.6 oz (86.9 kg)  Height: 5\' 7"  (1.702 m)    General appearance: well-appearing Eyes: tracking appropriately Lungs: CTAB, no crackles, no wheeze, with normal respiratory effort CV: 3/6 systolic ejection murmur heard throughout the precordium but best at upper sternal borders Extremities: No peripheral edema Skin: Normal temperature, turgor and texture; no rash Psych: Appropriate affect Neuro: Alert and oriented to person and place, no focal deficit   Assessment & Plan:  Hypertension [name]'s blood pressure today was 121/79, consistent with blood pressure readings at home. She denied any palpitations, chest pains, SOB or headaches. She is very active and has a healthy lifestyle. She is soon going on a hiking trip. She wants to be off her blood pressure medications. She is currently taking 10 mg amlodipine. Discussed with her on going down on the lowest dose and monitoring her blood pressures before completely stopping her medications.   Plan  - Decrease amlodipine to 5 mg (once she is back from her hiking trip Aug 22).  - telephone visit with Dr.Lau to discuss blood pressue once dose is decreased - continue monitoring bp at home   Hyperlipidemia Ms. Miranda Lopez currently takes rosuvastatin 10 mg and baby aspirin. She does not want to be on these medications, however she has elevated coronary artery calcium score and family history of heart disease in her dad. Her last lipid check was in 09/2022 with total cholesterol of 209 and LDL of 108. Given her history, we explained  to patient that it is best to  continue these medications to prevent future cardiovascular events.   Plan  - Continue rosuvastatin 10 mg and baby aspirin   Cervical neoplasm Discussed with patient since there has been no history of abnormal screening in 26 years since she was last treated for possible cervical dysplasia (no records of exact result or tx but patient reports this was done in 1998), we could consider graduation from screening. It has been more than 25 years, and she has had routine pap smears since then which have been normal. If she prefers, we could continue screening every 3 years given uncertain history and we will continue to discuss. However, advised to bring up any concerns such as foul smell which she had previously  experienced when dysplasia was found.   Plan -Continue screening discussion 2027  Mild mitral regurgitation Identified on TTE 10/2021. No recurrence of palpitations or dizziness. Systolic murmur stable on examination today. CTM.  Elevated coronary artery calcium score Ms. Winrow remains on statin and aspirin therapy for history of elevated CAC score and family history of heart disease. Today, we discussed continuing these medications for CV protection/prevention and she is amenable.  Chronic pain of right knee Referral to sports medicine was made at last visit for chronic right knee pain when walking on hard surfaces. She was evaluated and found to have moderate tricompartmental R knee arthritis. Limited US without discrete meniscal tear. Ms. Calhoon has since obtained new shoes with orthopedic inserts and reports her symptoms have since resolved. No further treatment at this time.  Patient discussed with Dr. Sol Miranda Lopez

## 2023-04-01 NOTE — Assessment & Plan Note (Signed)
Identified on TTE 10/2021. No recurrence of palpitations or dizziness. Systolic murmur stable on examination today. CTM.

## 2023-04-01 NOTE — Assessment & Plan Note (Signed)
Referral to sports medicine was made at last visit for chronic right knee pain when walking on hard surfaces. She was evaluated and found to have moderate tricompartmental R knee arthritis. Limited US without discrete meniscal tear. Miranda Lopez has since obtained new shoes with orthopedic inserts and reports her symptoms have since resolved. No further treatment at this time.

## 2023-04-01 NOTE — Assessment & Plan Note (Signed)
Miranda Lopez remains on statin and aspirin therapy for history of elevated CAC score and family history of heart disease. Today, we discussed continuing these medications for CV protection/prevention and she is amenable.

## 2023-04-01 NOTE — Assessment & Plan Note (Signed)
Miranda Lopez currently takes rosuvastatin 10 mg and baby aspirin. She does not want to be on these medications, however she has elevated coronary artery calcium score and family history of heart disease in her dad. Her last lipid check was in 09/2022 with total cholesterol of 209 and LDL of 108. Given her history, we explained  to patient that it is best to continue these medications to prevent future cardiovascular events.   Plan  - Continue rosuvastatin 10 mg and baby aspirin

## 2023-04-01 NOTE — Assessment & Plan Note (Addendum)
Discussed with patient since there has been no history of abnormal screening in 26 years since she was last treated for possible cervical dysplasia (no records of exact result or tx but patient reports this was done in 1998), we could consider graduation from screening. It has been more than 25 years, and she has had routine pap smears since then which have been normal. If she prefers, we could continue screening every 3 years given uncertain history and we will continue to discuss. However, advised to bring up any concerns such as foul smell which she had previously experienced when dysplasia was found.   Plan -Continue screening discussion 2027

## 2023-05-12 ENCOUNTER — Encounter: Payer: Self-pay | Admitting: Internal Medicine

## 2023-06-06 ENCOUNTER — Other Ambulatory Visit: Payer: Self-pay | Admitting: Internal Medicine

## 2023-06-06 DIAGNOSIS — I10 Essential (primary) hypertension: Secondary | ICD-10-CM

## 2023-06-06 MED ORDER — AMLODIPINE BESYLATE 5 MG PO TABS: 5.0000 mg | ORAL_TABLET | Freq: Every day | ORAL | 3 refills | Status: DC

## 2023-09-16 ENCOUNTER — Ambulatory Visit: Payer: PPO | Admitting: Internal Medicine

## 2023-09-16 ENCOUNTER — Encounter: Payer: Self-pay | Admitting: Internal Medicine

## 2023-09-16 VITALS — BP 131/83 | HR 70 | Temp 98.2°F | Ht 67.0 in | Wt 196.8 lb

## 2023-09-16 DIAGNOSIS — M25561 Pain in right knee: Secondary | ICD-10-CM | POA: Diagnosis not present

## 2023-09-16 DIAGNOSIS — R931 Abnormal findings on diagnostic imaging of heart and coronary circulation: Secondary | ICD-10-CM

## 2023-09-16 DIAGNOSIS — I10 Essential (primary) hypertension: Secondary | ICD-10-CM | POA: Diagnosis not present

## 2023-09-16 DIAGNOSIS — R059 Cough, unspecified: Secondary | ICD-10-CM

## 2023-09-16 DIAGNOSIS — Z23 Encounter for immunization: Secondary | ICD-10-CM | POA: Insufficient documentation

## 2023-09-16 DIAGNOSIS — B351 Tinea unguium: Secondary | ICD-10-CM | POA: Diagnosis not present

## 2023-09-16 DIAGNOSIS — E669 Obesity, unspecified: Secondary | ICD-10-CM | POA: Diagnosis not present

## 2023-09-16 DIAGNOSIS — R7303 Prediabetes: Secondary | ICD-10-CM

## 2023-09-16 DIAGNOSIS — E785 Hyperlipidemia, unspecified: Secondary | ICD-10-CM

## 2023-09-16 DIAGNOSIS — I340 Nonrheumatic mitral (valve) insufficiency: Secondary | ICD-10-CM | POA: Diagnosis not present

## 2023-09-16 DIAGNOSIS — D329 Benign neoplasm of meninges, unspecified: Secondary | ICD-10-CM | POA: Diagnosis not present

## 2023-09-16 DIAGNOSIS — I77810 Thoracic aortic ectasia: Secondary | ICD-10-CM

## 2023-09-16 DIAGNOSIS — Z1231 Encounter for screening mammogram for malignant neoplasm of breast: Secondary | ICD-10-CM | POA: Insufficient documentation

## 2023-09-16 DIAGNOSIS — G8929 Other chronic pain: Secondary | ICD-10-CM

## 2023-09-16 LAB — POCT GLYCOSYLATED HEMOGLOBIN (HGB A1C): Hemoglobin A1C: 5.6 % (ref 4.0–5.6)

## 2023-09-16 LAB — GLUCOSE, CAPILLARY: Glucose-Capillary: 100 mg/dL — ABNORMAL HIGH (ref 70–99)

## 2023-09-16 NOTE — Assessment & Plan Note (Signed)
Records indicate first of two series obtained on 05/03/2022. Ms. Miranda Lopez will contact her pharmacy for if she returned for the second dose and if not, will receive that vaccination.

## 2023-09-16 NOTE — Assessment & Plan Note (Addendum)
BP today is 137/78, 131/83 on repeat controlled by amlodipine 5 mg. She is not experiencing any palpitations, chest pain, SOB, or HA. She is continuing to hike actively and recently returned from a hiking trip in September. She reports that her blood pressures have been slightly higher since the holidays with home readings dipping into the 140s systolic which she attributes to some weight gain. She is committed to ongoing lifestyle modifications. If her pressures remain high at next visit, she is amenable to going back up to 10 mg. Last CMP in 09/2022 wnl, repeat today. - Continue amlodipine 5 mg - CTM - follow up CMP today

## 2023-09-16 NOTE — Progress Notes (Signed)
Please see attestation from student Tobi Bastos Kim's note for visit 09/16/23.   Dickie La, MD

## 2023-09-16 NOTE — Assessment & Plan Note (Addendum)
Identified on on TTE 10/2021. Does not report palpitations, dizziness, or syncope. Murmur noted to be quieter on exam today. CTM.

## 2023-09-16 NOTE — Patient Instructions (Addendum)
It was wonderful to see you today!  Please contact your pharmacy about if you have received the second portion of your shingles (Shingrix) vaccine. If not, you should be able to get that there.  If the cough gets worse or is still present in 4 weeks, please let us know. Also, please check those blood pressures at home and let us know if they stay in that upper 130s-140s range.   We have also ordered your annual mammogram.   We will call you about the results of your blood work.   I'm glad to hear you're doing so well! Keep up the good work and have a wonderful birthday in a few weeks!

## 2023-09-16 NOTE — Assessment & Plan Note (Signed)
Last lipid panel ion 09/2022 showed LDL of 108 and cholesterol of 209. She is currently prescribed rosuvastatin 10 mg and asprin 81 mg due to a family history of heart disease and an elevated coronary artery calcium score. No changes to medications today.  - follow up lipid panel today

## 2023-09-16 NOTE — Assessment & Plan Note (Signed)
Moderate tricompartmental R knee arthritis without discrete meniscal tear noted by sports medicine. Miranda Lopez uses orthopedic inserts in her shoes with great improvement and has a wrap that almost completely eliminates her pain.

## 2023-09-16 NOTE — Assessment & Plan Note (Addendum)
Last A1c in 09/2022 at 5.5%, repeat today. Continue healthy lifestyle modifications.

## 2023-09-16 NOTE — Assessment & Plan Note (Signed)
Remains on statin and aspirin therapy. We will continue these medications.

## 2023-09-16 NOTE — Assessment & Plan Note (Signed)
No history of abnormal screenings. Annual mammogram ordered today.

## 2023-09-16 NOTE — Assessment & Plan Note (Signed)
Miranda Lopez maintains an active lifestyle with healthy food choices. However, she admits she has not been as careful during the holidays and has gained about 5 lbs since last visit. She would like to lose the pounds again and reports that she has lost a pound in the last week and a half with healthy choices. She is motivated to continue losing weight, as she attributes her elevated blood pressure to this increase.

## 2023-09-16 NOTE — Assessment & Plan Note (Signed)
Normal size/structure noted on most recent TTE 10/2022. Nothing further at this time.

## 2023-09-16 NOTE — Assessment & Plan Note (Addendum)
Present for many years, previously prescribed topical Penlac for control. Miranda Lopez reports that this improved her symptoms for a while but that the nail changes persisted and have now spread to other toenails of the same and opposite foot. She notes that the toenail fell off twice in the last 6 months and is frustrated that the nail changes recurred. Discussed oral medications with potential side effects and she would like to continue topical medications. Expectations that this may improve but unlikely to completely resolve were also discussed.

## 2023-09-16 NOTE — Assessment & Plan Note (Addendum)
MRI 11/2022 with stable findings. Repeat in 2026 per neurosurgery recommendations.

## 2023-09-16 NOTE — Progress Notes (Addendum)
The care of the patient was discussed with Dr. Sol Blazing and the assessment and plan was formulated with their assistance.  Please see their note for official documentation of the patient encounter.   Subjective:   Patient ID: Miranda Lopez female   DOB: Nov 04, 1955 68 y.o.   MRN: [id number]  HPI: Miranda Lopez is a 68 y.o. female presenting for 6 weeks of resolving dry cough and hypertension management. Overall, she has been doing well, having returned from a hiking trip in September and spent the holidays with her family. She is looking forward to her 68th birthday plus Superbowl party in a few weeks. Her cough presented after caroling and has been improving since. Her blood pressure has increased, which she attributes to weight gain over the holidays and she is continuing to monitor at home.   Past Medical History:  Diagnosis Date   Actinic keratosis of forehead 10/07/2022   Cancer (HCC)    cervical cancer   Contusion of head, subsequent encounter 11/29/2020   COVID-19    patient reported 07/2018   Diverticulitis    last epiosde 10-11 yrs ago   Eclampsia    Palpitations 10/15/2021   Poison ivy 01/27/2022   Screening for colon cancer 11/29/2020   Hx of adenomatous colonic polyps   Vertigo 11/08/2021   Current Outpatient Medications  Medication Sig Dispense Refill   amLODipine (NORVASC) 5 MG tablet Take 1 tablet (5 mg total) by mouth daily. 90 tablet 3   aspirin 81 MG chewable tablet Chew 81 mg by mouth daily.     ciclopirox (PENLAC) 8 % solution Apply topically at bedtime. Apply over nail and surrounding skin. Apply daily over previous coat. After seven (7) days, may remove with alcohol and continue cycle. 6.6 mL 0   EPINEPHrine 0.3 mg/0.3 mL IJ SOAJ injection Inject 0.3 mg into the muscle as needed for anaphylaxis. 4 each 0   rosuvastatin (CRESTOR) 10 MG tablet TAKE 1 TABLET(10 MG) BY MOUTH DAILY 90 tablet 3   No current facility-administered medications for this  visit.   Family History  Problem Relation Age of Onset   Heart failure Father 28   CAD Father 72       Heart attack and quadruple bypass at age 26.   Breast cancer Paternal Aunt    Colon cancer Neg Hx    Colon polyps Neg Hx    Esophageal cancer Neg Hx    Rectal cancer Neg Hx    Stomach cancer Neg Hx    Social History   Socioeconomic History   Marital status: Single    Spouse name: Not on file   Number of children: Not on file   Years of education: Not on file   Highest education level: Not on file  Occupational History   Not on file  Tobacco Use   Smoking status: Never   Smokeless tobacco: Never  Substance and Sexual Activity   Alcohol use: Yes    Alcohol/week: 7.0 standard drinks of alcohol    Types: 7 Glasses of wine per week    Comment: wine with dinner nightly   Drug use: Not on file   Sexual activity: Not on file  Other Topics Concern   Not on file  Social History Narrative   Currently working, owns two businesses.   Social Drivers of Health   Financial Resource Strain: Low Risk  (10/07/2022)   Overall Financial Resource Strain (CARDIA)    Difficulty of Paying Living Expenses:  Not hard at all  Food Insecurity: No Food Insecurity (09/16/2023)   Hunger Vital Sign    Worried About Running Out of Food in the Last Year: Never true    Ran Out of Food in the Last Year: Never true  Transportation Needs: No Transportation Needs (09/16/2023)   PRAPARE - Administrator, Civil Service (Medical): No    Lack of Transportation (Non-Medical): No  Physical Activity: Patient Declined (10/07/2022)   Exercise Vital Sign    Days of Exercise per Week: Patient declined    Minutes of Exercise per Session: Patient declined  Stress: Patient Declined (10/07/2022)   [employer] of Occupational Health - Occupational Stress Questionnaire    Feeling of Stress : Patient declined  Social Connections: Patient Declined (10/07/2022)   Social Connection and Isolation Panel  [NHANES]    Frequency of Communication with Friends and Family: Patient declined    Frequency of Social Gatherings with Friends and Family: Patient declined    Attends Religious Services: Patient declined    Database administrator or Organizations: Patient declined    Attends Banker Meetings: Patient declined    Marital Status: Patient declined   Review of Systems: Pertinent items noted in HPI and remainder of comprehensive ROS otherwise negative.  Objective:  Physical Exam: Vitals:   09/16/23 0841 09/16/23 0934  BP: 137/78 131/83  Pulse: 77 70  Temp: 98.2 F (36.8 C)   TempSrc: Oral   SpO2: 100%   Weight: 196 lb 12.8 oz (89.3 kg)   Height: 5\' 7"  (1.702 m)    BP 131/83 (BP Location: Right Arm, Patient Position: Sitting, Cuff Size: Normal)   Pulse 70   Temp 98.2 F (36.8 C) (Oral)   Ht 5\' 7"  (1.702 m)   Wt 196 lb 12.8 oz (89.3 kg)   SpO2 100% Comment: RA  BMI 30.82 kg/m   General Appearance:    Alert, cooperative, no distress  Head:    Normocephalic, atraumatic  Lungs:     Clear to auscultation bilaterally, normal work of breathing on room air   Heart:    2/6 systolic ejection murmur heard best at upper sternal borders  Extremities:   Extremities normal, atraumatic, no cyanosis or edema  Skin:   Skin color, texture, turgor normal; noted thickening and discoloration of the first two toenails on either foot, erythematous and thickened skin on the lateral aspect of both great toes and top of L second toe.   Neurologic/Psych:   Grossly normal strength and gait, no focal deficits. Mood and affect pleasant.    Assessment & Plan:  Cough (>4 weeks) Miranda Lopez presents today with a persistent cough for about 6 weeks, which she first noted after caroling too long on a cold day before the holidays. She did not note any fevers, drainage, reflux, chest pain, or SOB during this time. She describes the cough as dry with occasional production of white sputum and that it  has improved to maybe two coughs a day during this time. Likely to be a post-viral cough given duration and lack of concerning symptoms. Counseled that the cough may take more time to resolve and to return if it worsens or persists.   Hypertension BP today is 137/78, 131/83 on repeat controlled by amlodipine 5 mg. She is not experiencing any palpitations, chest pain, SOB, or HA. She is continuing to hike actively and recently returned from a hiking trip in September. She reports that her blood pressures have  been slightly higher since the holidays with home readings dipping into the 140s systolic which she attributes to some weight gain. She is committed to ongoing lifestyle modifications. If her pressures remain high at next visit, she is amenable to going back up to 10 mg. Last CMP in 09/2022 wnl, repeat today. - Continue amlodipine 5 mg - CTM - follow up CMP today  Hyperlipidemia Last lipid panel ion 09/2022 showed LDL of 108 and cholesterol of 209. She is currently prescribed rosuvastatin 10 mg and asprin 81 mg due to a family history of heart disease and an elevated coronary artery calcium score. No changes to medications today.  - follow up lipid panel today  Prediabetes Last A1c in 09/2022 at 5.5%, repeat today. Continue healthy lifestyle modifications.   Mild mitral regurgitation Identified on on TTE 10/2021. Does not report palpitations, dizziness, or syncope. Murmur noted to be quieter on exam today. CTM.  Obesity (BMI 30-39.9) Miranda Lopez maintains an active lifestyle with healthy food choices. However, she admits she has not been as careful during the holidays and has gained about 5 lbs since last visit. She would like to lose the pounds again and reports that she has lost a pound in the last week and a half with healthy choices. She is motivated to continue losing weight, as she attributes her elevated blood pressure to this increase.   Meningioma (HCC) MRI 11/2022 with stable  findings. Repeat in 2026 per neurosurgery recommendations.  Screening mammogram for breast cancer No history of abnormal screenings. Annual mammogram ordered today.   Chronic pain of right knee Moderate tricompartmental R knee arthritis without discrete meniscal tear noted by sports medicine. Miranda Lopez uses orthopedic inserts in her shoes with great improvement and has a wrap that almost completely eliminates her pain.   Onychomycosis Present for many years, previously prescribed topical Penlac for control. Miranda Lopez reports that this improved her symptoms for a while but that the nail changes persisted and have now spread to other toenails of the same and opposite foot. She notes that the toenail fell off twice in the last 6 months and is frustrated that the nail changes recurred. Discussed oral medications with potential side effects and she would like to continue topical medications. Expectations that this may improve but unlikely to completely resolve were also discussed.   Need for vaccination for zoster Records indicate first of two series obtained on 05/03/2022. Miranda Lopez will contact her pharmacy for if she returned for the second dose and if not, will receive that vaccination.  Aortic root dilation (HCC) Normal size/structure noted on most recent TTE 10/2022. Nothing further at this time.  Elevated coronary artery calcium score Remains on statin and aspirin therapy. We will continue these medications.  Thea Alken, MS3

## 2023-09-17 ENCOUNTER — Encounter: Payer: Self-pay | Admitting: Internal Medicine

## 2023-09-17 DIAGNOSIS — B351 Tinea unguium: Secondary | ICD-10-CM

## 2023-09-17 LAB — CMP14 + ANION GAP
ALT: 10 [IU]/L (ref 0–32)
AST: 18 [IU]/L (ref 0–40)
Albumin: 4.3 g/dL (ref 3.9–4.9)
Alkaline Phosphatase: 107 [IU]/L (ref 44–121)
Anion Gap: 15 mmol/L (ref 10.0–18.0)
BUN/Creatinine Ratio: 16 (ref 12–28)
BUN: 14 mg/dL (ref 8–27)
Bilirubin Total: 0.4 mg/dL (ref 0.0–1.2)
CO2: 23 mmol/L (ref 20–29)
Calcium: 9.8 mg/dL (ref 8.7–10.3)
Chloride: 100 mmol/L (ref 96–106)
Creatinine, Ser: 0.85 mg/dL (ref 0.57–1.00)
Globulin, Total: 2.6 g/dL (ref 1.5–4.5)
Glucose: 103 mg/dL — ABNORMAL HIGH (ref 70–99)
Potassium: 5.1 mmol/L (ref 3.5–5.2)
Sodium: 138 mmol/L (ref 134–144)
Total Protein: 6.9 g/dL (ref 6.0–8.5)
eGFR: 74 mL/min/{1.73_m2} (ref >59)

## 2023-09-17 LAB — LIPID PANEL
Chol/HDL Ratio: 1.9 {ratio} (ref 0.0–4.4)
Cholesterol, Total: 190 mg/dL (ref 100–199)
HDL: 100 mg/dL (ref >39)
LDL Chol Calc (NIH): 76 mg/dL (ref 0–99)
Triglycerides: 80 mg/dL (ref 0–149)
VLDL Cholesterol Cal: 14 mg/dL (ref 5–40)

## 2023-09-17 MED ORDER — TAVABOROLE 5 % EX SOLN: CUTANEOUS | 0 refills | Status: DC

## 2023-09-17 MED ORDER — EFINACONAZOLE 10 % EX SOLN: CUTANEOUS | 0 refills | Status: DC

## 2023-09-17 NOTE — Progress Notes (Signed)
A1c within normal range. No further evidence of prediabetes.

## 2023-09-17 NOTE — Progress Notes (Signed)
CMP reviewed

## 2023-09-17 NOTE — Progress Notes (Signed)
Lipid panel improved, continue rosuvastatin daily.

## 2023-09-18 NOTE — Telephone Encounter (Signed)
Spoke with patient by telephone 1/23. She would like to continue topical ciclopirox for now. If no improvement, she will consider oral medications. We also discussed referral to podiatry for toenail removal and treatment if she is interested in the future.

## 2023-09-18 NOTE — Addendum Note (Signed)
Addended by: Dickie La on: 09/18/2023 12:44 PM   Modules accepted: Orders

## 2023-10-21 ENCOUNTER — Other Ambulatory Visit: Payer: Self-pay | Admitting: Medical Genetics

## 2023-10-30 ENCOUNTER — Telehealth: Payer: Self-pay

## 2023-10-30 ENCOUNTER — Encounter: Payer: Self-pay | Admitting: Internal Medicine

## 2023-10-30 DIAGNOSIS — B351 Tinea unguium: Secondary | ICD-10-CM

## 2023-10-30 MED ORDER — CICLOPIROX 8 % EX SOLN: 1.0000 | Freq: Every day | CUTANEOUS | 1 refills | Status: AC

## 2023-10-30 NOTE — Telephone Encounter (Signed)
 Miranda Lopez (Key: [id number]) PA Case ID #: [id number] Rx #: [id number] Need Help? Call us at [phone] Outcome Approved today by [facility] [employer] 2017 06-MAR-25:05-FEB-26 Ciclopirox 8% EX SOLN Quantity:7; Drug Ciclopirox 8% solution ePA cloud logo Form [facility] [employer] Electronic Prior Authorization Form 2017 NCPDP Original Claim Info [phone] PA REQD - [id number] Transmission Accepted

## 2023-10-30 NOTE — Telephone Encounter (Signed)
 Prior Authorization for patient (Ciclopirox 8% solution) came through on cover my meds was submitted with last office notes awaiting approval or denial.  WNU:[id number]

## 2023-11-13 ENCOUNTER — Ambulatory Visit
Admission: RE | Admit: 2023-11-13 | Discharge: 2023-11-13 | Disposition: A | Discharge status: Home / Self Care | Payer: PPO | Source: Ambulatory Visit | Attending: Internal Medicine | Admitting: Internal Medicine

## 2023-11-13 DIAGNOSIS — Z1231 Encounter for screening mammogram for malignant neoplasm of breast: Secondary | ICD-10-CM

## 2023-12-02 ENCOUNTER — Encounter: Payer: Self-pay | Admitting: Internal Medicine

## 2023-12-02 DIAGNOSIS — I10 Essential (primary) hypertension: Secondary | ICD-10-CM

## 2023-12-02 MED ORDER — AMLODIPINE BESYLATE 10 MG PO TABS: 10.0000 mg | ORAL_TABLET | Freq: Every day | ORAL | 3 refills | Status: AC

## 2024-01-24 ENCOUNTER — Other Ambulatory Visit: Payer: Self-pay | Admitting: Internal Medicine

## 2024-01-26 NOTE — Telephone Encounter (Signed)
 Medication sent to pharmacy

## 2024-02-16 ENCOUNTER — Encounter: Payer: Self-pay | Admitting: Internal Medicine

## 2024-02-17 NOTE — Telephone Encounter (Signed)
 Spoke with the patient:  Name: Miranda Lopez, Miranda Lopez MRN: [id number]  Date: 02/18/2024 Status: Sch  Time: 10:45 AM Length: 30  Visit Type: OPEN ESTABLISHED [726] Copay: $0.00  Provider: Norrine Sharper, MD       Copied from CRM [id number]. Topic: Referral - Request for Referral >> Feb 16, 2024  2:23 PM Laurier C wrote: Did the patient discuss referral with their provider in the last year? Yes (If No - schedule appointment) (If Yes - send message)  Appointment offered? No  Type of order/referral and detailed reason for visit: Sports Medicine  Preference of office, provider, location: No preference, anyone who will accept patients insurance.   If referral order, have you been seen by this specialty before? Yes (If Yes, this issue or another issue? When? Where?  Can we respond through MyChart? Yes

## 2024-02-18 ENCOUNTER — Other Ambulatory Visit: Payer: Self-pay

## 2024-02-18 ENCOUNTER — Ambulatory Visit: Admitting: Student

## 2024-02-18 VITALS — BP 144/82 | HR 73 | Temp 98.3°F | Ht 67.0 in | Wt 196.4 lb

## 2024-02-18 DIAGNOSIS — S76302A Unspecified injury of muscle, fascia and tendon of the posterior muscle group at thigh level, left thigh, initial encounter: Secondary | ICD-10-CM

## 2024-02-18 NOTE — Assessment & Plan Note (Addendum)
 Limited encounter today due to patient having to leave for important meeting. Reports posterior distal thigh pain for past few days. Notes prior hx of pain for few weeks but worsening. Has some limping due to unable to full bear weight on LLE. Denies injury or trauma. However, has been avidly hiking recently and reports hiked 6.5 miles last Thursday. Reports hx of hamstring tear 15 years ago and concern for similar symptoms. Gait today favoring right leg, some swelling of left knee, no erythema, slight ecchymosis of posterior thigh.   Plan -Referral back to sports medicine (seen in past for R knee) -Discussed if needed to go to Ortho [facility] if symptoms worsen

## 2024-02-18 NOTE — Progress Notes (Unsigned)
 CC: acute visit  HPI:  [name] is a 68 y.o. female living with a history stated below and presents today for acute visit. Please see problem based assessment and plan for additional details.  Past Medical History:  Diagnosis Date   Actinic keratosis of forehead 10/07/2022   Cancer (HCC)    cervical cancer   Contusion of head, subsequent encounter 11/29/2020   COVID-19    patient reported 07/2018   Diverticulitis    last epiosde 10-11 yrs ago   Eclampsia    Palpitations 10/15/2021   Poison ivy 01/27/2022   Screening for colon cancer 11/29/2020   Hx of adenomatous colonic polyps   Vertigo 11/08/2021    Current Outpatient Medications on File Prior to Visit  Medication Sig Dispense Refill   amLODipine  (NORVASC ) 10 MG tablet Take 1 tablet (10 mg total) by mouth daily. 90 tablet 3   aspirin 81 MG chewable tablet Chew 81 mg by mouth daily.     ciclopirox  (PENLAC ) 8 % solution Apply 1 Dose topically at bedtime. Apply over nail and surrounding skin. Apply daily over previous coat. After seven (7) days, may remove with alcohol and continue cycle. 6 mL 1   EPINEPHrine  0.3 mg/0.3 mL IJ SOAJ injection Inject 0.3 mg into the muscle as needed for anaphylaxis. 4 each 0   rosuvastatin  (CRESTOR ) 10 MG tablet TAKE 1 TABLET(10 MG) BY MOUTH DAILY 90 tablet 3   No current facility-administered medications on file prior to visit.    Family History  Problem Relation Age of Onset   Heart failure Father 50   CAD Father 39       Heart attack and quadruple bypass at age 48.   Breast cancer Paternal Aunt    Colon cancer Neg Hx    Colon polyps Neg Hx    Esophageal cancer Neg Hx    Rectal cancer Neg Hx    Stomach cancer Neg Hx     Social History   Socioeconomic History   Marital status: Single    Spouse name: Not on file   Number of children: Not on file   Years of education: Not on file   Highest education level: Not on file  Occupational History   Not on file  Tobacco  Use   Smoking status: Never   Smokeless tobacco: Never  Substance and Sexual Activity   Alcohol use: Yes    Alcohol/week: 7.0 standard drinks of alcohol    Types: 7 Glasses of wine per week    Comment: wine with dinner nightly   Drug use: Not on file   Sexual activity: Not on file  Other Topics Concern   Not on file  Social History Narrative   Currently working, owns two businesses.   Social Drivers of Corporate investment banker Strain: Low Risk  (02/18/2024)   Overall Financial Resource Strain (CARDIA)    Difficulty of Paying Living Expenses: Not hard at all  Food Insecurity: No Food Insecurity (09/16/2023)   Hunger Vital Sign    Worried About Running Out of Food in the Last Year: Never true    Ran Out of Food in the Last Year: Never true  Transportation Needs: Unknown (09/16/2023)   PRAPARE - Administrator, Civil Service (Medical): No    Lack of Transportation (Non-Medical): Not on file  Physical Activity: Sufficiently Active (02/18/2024)   Exercise Vital Sign    Days of Exercise per Week: 7 days  Minutes of Exercise per Session: 120 min  Stress: Patient Declined (10/07/2022)   [employer] of Occupational Health - Occupational Stress Questionnaire    Feeling of Stress : Patient declined  Social Connections: Moderately Integrated (02/18/2024)   Social Connection and Isolation Panel    Frequency of Communication with Friends and Family: More than three times a week    Frequency of Social Gatherings with Friends and Family: More than three times a week    Attends Religious Services: More than 4 times per year    Active Member of [employer] or Organizations: Yes    Attends Banker Meetings: 1 to 4 times per year    Marital Status: Divorced  Catering manager Violence: Not At Risk (09/16/2023)   Humiliation, Afraid, Rape, and Kick questionnaire    Fear of Current or Ex-Partner: No    Emotionally Abused: No    Physically Abused: No    Sexually Abused:  No    Review of Systems: ROS negative except for what is noted on the assessment and plan.  Vitals:   02/18/24 1050  BP: (!) 144/82  Pulse: 73  Temp: 98.3 F (36.8 C)  TempSrc: Oral  SpO2: 98%  Weight: 196 lb 6.4 oz (89.1 kg)  Height: 5' 7 (1.702 m)    Physical Exam: Constitutional: well-appearing female sitting in chair, in no acute distress Cardiovascular: regular rate  Pulmonary/Chest: normal work of breathing on room air MSK: TTP of posterior left thigh, some ecchymosis noted, ROM of left knee limited with pain, mild swelling  Neurological: alert & oriented x 3  Assessment & Plan:   Left hamstring injury Limited encounter today due to patient having to leave for important meeting. Reports posterior distal thigh pain for past few days. Notes prior hx of pain for few weeks but worsening. Has some limping due to unable to full bear weight on LLE. Denies injury or trauma. However, has been avidly hiking recently and reports hiked 6.5 miles last Thursday. Reports hx of hamstring tear 15 years ago and concern for similar symptoms. Gait today favoring right leg, some swelling of left knee, no erythema, slight ecchymosis of posterior thigh.   Plan -Referral back to sports medicine (seen in past for R knee) -Discussed if needed to go to Ortho UC if symptoms worsen     Patient discussed with Dr. Winfrey  Jamaris Theard, D.O. [facility] Internal Medicine, PGY-2 Phone: [phone] Date 02/18/2024 Time 12:03 PM

## 2024-02-19 ENCOUNTER — Encounter: Payer: Self-pay | Admitting: Student

## 2024-02-20 NOTE — Progress Notes (Signed)
 Internal Medicine Clinic Attending  Case discussed with the resident at the time of the visit.  We reviewed the resident's history and exam and pertinent patient test results.  I agree with the assessment, diagnosis, and plan of care documented in the resident's note.

## 2024-02-23 ENCOUNTER — Ambulatory Visit: Admitting: Family Medicine

## 2024-02-23 ENCOUNTER — Encounter: Payer: Self-pay | Admitting: Family Medicine

## 2024-02-23 VITALS — BP 132/84 | Ht 67.0 in | Wt 189.0 lb

## 2024-02-23 DIAGNOSIS — M25562 Pain in left knee: Secondary | ICD-10-CM

## 2024-02-23 NOTE — Patient Instructions (Signed)
 We will go ahead with an MRI of your knee. Wear the sleeve you have when up and walking around. Icing 15 minutes at a time at least 3-4 times a day. Tylenol 500mg  1-2 tabs three times a day as needed for pain. Aleve 1-2 tabs twice a day with food for pain and inflammation as needed. Schedule a no-charge virtual visit to go over your MRI results and next steps when these come back.

## 2024-02-23 NOTE — Progress Notes (Signed)
 PCP: Karna Fellows, MD  Subjective:   HPI: Patient is a 68 y.o. female here for left leg pain.  Patient reports she developed pain in posterior left hamstring/distal thigh area about 1.5-2 weeks ago. No injury or trauma. Started when she was walking which she does a lot (hiking). Also golfs and walks her dog. Was in significant pain in last 1 mile of her walk and wasn't sure she'd get back to her vehicle. Tuesday she felt a lump posterior left knee. Icing has helped. No numbness or tingling or back pain. Feels better when knee is flexed.  Past Medical History:  Diagnosis Date   Actinic keratosis of forehead 10/07/2022   Cancer (HCC)    cervical cancer   Contusion of head, subsequent encounter 11/29/2020   COVID-19    patient reported 07/2018   Diverticulitis    last epiosde 10-11 yrs ago   Eclampsia    Palpitations 10/15/2021   Poison ivy 01/27/2022   Screening for colon cancer 11/29/2020   Hx of adenomatous colonic polyps   Vertigo 11/08/2021    Current Outpatient Medications on File Prior to Visit  Medication Sig Dispense Refill   amLODipine  (NORVASC ) 10 MG tablet Take 1 tablet (10 mg total) by mouth daily. 90 tablet 3   aspirin 81 MG chewable tablet Chew 81 mg by mouth daily.     ciclopirox  (PENLAC ) 8 % solution Apply 1 Dose topically at bedtime. Apply over nail and surrounding skin. Apply daily over previous coat. After seven (7) days, may remove with alcohol and continue cycle. 6 mL 1   EPINEPHrine  0.3 mg/0.3 mL IJ SOAJ injection Inject 0.3 mg into the muscle as needed for anaphylaxis. 4 each 0   rosuvastatin  (CRESTOR ) 10 MG tablet TAKE 1 TABLET(10 MG) BY MOUTH DAILY 90 tablet 3   No current facility-administered medications on file prior to visit.    Past Surgical History:  Procedure Laterality Date   cervix removed for cervical cancer     CESAREAN SECTION     x2   COLONOSCOPY     HERNIA REPAIR     age 65   POLYPECTOMY      Allergies  Allergen Reactions    Bee Venom Shortness Of Breath   Propofol Cough    Patient received this medication during a colonoscopy for sedation.  Had significant cough for next couple of days.  Not clear this is medication associated but will document for purposes and use in future per patient request.   Robinul [Glycopyrrolate] Cough    Patient received this medication during a colonoscopy for secretions.  Had significant cough for next couple of days.  Not clear this is medication associated but will document for purposes and use in future per patient request.    BP 132/84   Ht 5' 7 (1.702 m)   Wt 189 lb (85.7 kg)   BMI 29.60 kg/m       No data to display              No data to display              Objective:  Physical Exam:  Gen: NAD, comfortable in exam room but guarding through exam.  Left knee: Mild effusion, fullness posterior knee.  No other gross deformity, ecchymoses. TTP diffusely anterior knee and popliteal fossa. Lacks about 10 degrees full extension, can flex to 90 degrees Negative ant/post drawers. Negative valgus/varus testing.  NV intact distally.   Limited MSK u/s left  knee:  effusion with apparent hemarthrosis.  Large baker's cyst.  Venous structures compressible popliteal fossa.  Assessment & Plan:  1. Left knee pain - unusual presentation including what appears to be a hemarthrosis without an acute injury.  Difficulty with motion and guarding on exam due to level of discomfort.  Will proceed with MRI to assess.  Icing, tylenol, aleve if needed in meantime.  Sleeve.

## 2024-02-29 ENCOUNTER — Ambulatory Visit
Admission: RE | Admit: 2024-02-29 | Discharge: 2024-02-29 | Disposition: A | Discharge status: Home / Self Care | Source: Ambulatory Visit | Attending: Family Medicine | Admitting: Family Medicine

## 2024-02-29 DIAGNOSIS — M25462 Effusion, left knee: Secondary | ICD-10-CM | POA: Diagnosis not present

## 2024-02-29 DIAGNOSIS — M94262 Chondromalacia, left knee: Secondary | ICD-10-CM | POA: Diagnosis not present

## 2024-02-29 DIAGNOSIS — M25562 Pain in left knee: Secondary | ICD-10-CM

## 2024-02-29 DIAGNOSIS — M23322 Other meniscus derangements, posterior horn of medial meniscus, left knee: Secondary | ICD-10-CM | POA: Diagnosis not present

## 2024-03-03 ENCOUNTER — Ambulatory Visit (INDEPENDENT_AMBULATORY_CARE_PROVIDER_SITE_OTHER): Admitting: Family Medicine

## 2024-03-03 ENCOUNTER — Other Ambulatory Visit: Payer: Self-pay

## 2024-03-03 ENCOUNTER — Ambulatory Visit: Admitting: Family Medicine

## 2024-03-03 VITALS — BP 130/82 | Ht 67.0 in | Wt 189.0 lb

## 2024-03-03 DIAGNOSIS — M25562 Pain in left knee: Secondary | ICD-10-CM

## 2024-03-03 MED ORDER — METHYLPREDNISOLONE ACETATE 40 MG/ML IJ SUSP
40.0000 mg | Freq: Once | INTRAMUSCULAR | Status: AC
  Administered 2024-03-03: 40 mg via INTRA_ARTICULAR

## 2024-03-03 NOTE — Progress Notes (Signed)
 Patient returns today to review MRI results and next steps.  Her MRI showed severe medial compartment arthritis with areas of full-thickness loss but also loose bodies, effusion, baker's cyst.  Suspect with her presentation one of these loose bodies acutely separated causing the hemarthrosis seen at last visit.  Effusion smaller today.  Went ahead with intraarticular injection today.  Icing, sleeve, tylenol, aleve if needed.  Quad strengthening and follow up in 1 month.  After informed written consent timeout was performed, patient was lying supine on exam table. Left knee was prepped with alcohol swab and utilizing superolateral approach with ultrasound guidance, patient's left knee was injected intraarticularly with 3:1 lidocaine: depomedrol. Patient tolerated the procedure well without immediate complications.

## 2024-03-03 NOTE — Patient Instructions (Signed)
 You have arthritis and current pain suggests one of these loose bodies came off and caused a lot of swelling. You were given an injection today. Ice 15 minutes at a time as needed. Use the sleeve when up and walking around. Do quad strengthening exercises. Tylenol, aleve if needed. Increase walking distance every other day like we discussed. Follow up with me in 1 month.

## 2024-04-15 ENCOUNTER — Encounter: Payer: Self-pay | Admitting: Internal Medicine

## 2024-04-20 ENCOUNTER — Encounter: Payer: Self-pay | Admitting: Internal Medicine

## 2024-04-20 ENCOUNTER — Ambulatory Visit: Admitting: Internal Medicine

## 2024-04-20 VITALS — BP 137/81 | HR 72 | Temp 98.1°F | Ht 67.0 in | Wt 193.2 lb

## 2024-04-20 DIAGNOSIS — W57XXXA Bitten or stung by nonvenomous insect and other nonvenomous arthropods, initial encounter: Secondary | ICD-10-CM | POA: Diagnosis not present

## 2024-04-20 DIAGNOSIS — K122 Cellulitis and abscess of mouth: Secondary | ICD-10-CM | POA: Diagnosis not present

## 2024-04-20 DIAGNOSIS — R5383 Other fatigue: Secondary | ICD-10-CM

## 2024-04-20 DIAGNOSIS — Z860101 Personal history of adenomatous and serrated colon polyps: Secondary | ICD-10-CM

## 2024-04-20 NOTE — Assessment & Plan Note (Addendum)
 She noticed a bump on her gums last week incidentally when pressing on the side of her left face, which caused her some pain. She noticed a bump on the gum of the left side of her lower teeth that was erythematous and swollen with a moveable bump inside. She had something similar to this about 10 years ago on the right side of her mouth. She began to clean this area with hydrogen peroxide on a piece of gauze that she would use to apply pressure to the area, along with an antibacterial mouthwash. On Sunday, she was able to release yellow pus from this area and the swelling subsided. She has had no fever, chills, joint pain, rashes, diarrhea, nausea, vomiting, hematochezia, melena, hematuria, constipation, history of cold sore, recent consumption of hot or sharp foods, trauma, abnormal bleeding, white plaques, or new toothpastes. She had noticed some white spots on the bump initially, but they resolved after she released pus from the bump. This is most likely an abscess given presentation and release of pus from the bump, but usually fever or signs of infection would accompany this. This is less likely HSV given patient has no history of cold sores and lesion does not appear to be an ulceration or painful. Recurrent aphthous stomatitis could be considered, but would be less likely given that lesion does not appear to be an ulcer, released pus, and was not painful. Less likely malignancy since there is no recurrent ulceration in the same area of the gum and patient does not have history of tobacco use. Antibiotics would not be necessary right now since pus pocket has been released. Would be important to follow up with dentist to mention if there is a reoccurrence of this . - continue to monitor - follow up with dentist

## 2024-04-20 NOTE — Progress Notes (Signed)
 This is a Psychologist, occupational Note.  The care of the patient was discussed with Dr. Karna and the assessment and plan was formulated with their assistance.  Please see their note for official documentation of the patient encounter.   Subjective:   Patient ID: Miranda Lopez female   DOB: [birth year] 68 y.o.   MRN: [id number]  HPI: [name] is a 69 y.o. with PMH of vertigo, HTN, HLD who presents for concerns about a bump on her gums and a new circular lesion of the left breast.  For the details of today's visit, please refer to the assessment and plan.     Past Medical History:  Diagnosis Date   Actinic keratosis of forehead 10/07/2022   Cancer (HCC)    cervical cancer   Contusion of head, subsequent encounter 11/29/2020   COVID-19    patient reported 07/2018   Diverticulitis    last epiosde 10-11 yrs ago   Eclampsia    Palpitations 10/15/2021   Poison ivy 01/27/2022   Screening for colon cancer 11/29/2020   Hx of adenomatous colonic polyps   Vertigo 11/08/2021   Current Outpatient Medications  Medication Sig Dispense Refill   amLODipine  (NORVASC ) 10 MG tablet Take 1 tablet (10 mg total) by mouth daily. 90 tablet 3   aspirin 81 MG chewable tablet Chew 81 mg by mouth daily.     ciclopirox  (PENLAC ) 8 % solution Apply 1 Dose topically at bedtime. Apply over nail and surrounding skin. Apply daily over previous coat. After seven (7) days, may remove with alcohol and continue cycle. 6 mL 1   EPINEPHrine  0.3 mg/0.3 mL IJ SOAJ injection Inject 0.3 mg into the muscle as needed for anaphylaxis. 4 each 0   rosuvastatin  (CRESTOR ) 10 MG tablet TAKE 1 TABLET(10 MG) BY MOUTH DAILY 90 tablet 3   No current facility-administered medications for this visit.    Review of Systems: Pertinent items are noted in HPI. Objective:  Physical Exam: Vitals:   04/20/24 0855  BP: 137/81  Pulse: 72  Temp: 98.1 F (36.7 C)  TempSrc: Oral  SpO2: 98%  Weight: 193 lb 3.2 oz (87.6 kg)   Height: 5' 7 (1.702 m)    Constitutional: NAD, appears comfortable Cardiovascular: RRR, no murmurs, rubs, or gallops.  Pulmonary/Chest: CTAB, no wheezes, rales, or rhonchi. Mouth: left side of lower set of teeth revealing of area of erythema about 1 cm x 1 cm in area with a small area of scabbing near gum line. Left breast: medial aspect of left breast revealing of circular area about 3.5 inches in diameter that has bruised red, purple, and yellow coloring. It appears erythematous but is not warm to touch. There is no tenderness ot palpation and no underlying masses. There is a defined outline of this circular area that is purple in color. This area is without excoriations, discharge, or bleeding. There is no associated swelling of the breast, nipple redness, or nipple discharge.   Skin: No rashes or erythema. Scattered scabs and intermittent bug bites on bilateral legs.  Psychiatric: Normal mood and affect  Assessment & Plan:   Abscess of mouth She noticed a bump on her gums last week incidentally when pressing on the side of her left face, which caused her some pain. She noticed a bump on the gum of the left side of her lower teeth that was erythematous and swollen with a moveable bump inside. She had something similar to this about 10 years ago on the  right side of her mouth. She began to clean this area with hydrogen peroxide on a piece of gauze that she would use to apply pressure to the area, along with an antibacterial mouthwash. On Sunday, she was able to release yellow pus from this area and the swelling subsided. She has had no fever, chills, joint pain, rashes, diarrhea, nausea, vomiting, hematochezia, melena, hematuria, constipation, history of cold sore, recent consumption of hot or sharp foods, trauma, abnormal bleeding, white plaques, or new toothpastes. She had noticed some white spots on the bump initially, but they resolved after she released pus from the bump. This is most likely  an abscess given presentation and release of pus from the bump, but usually fever or signs of infection would accompany this. This is less likely HSV given patient has no history of cold sores and lesion does not appear to be an ulceration or painful. Recurrent aphthous stomatitis could be considered, but would be less likely given that lesion does not appear to be an ulcer, released pus, and was not painful. Less likely malignancy since there is no recurrent ulceration in the same area of the gum and patient does not have history of tobacco use. Antibiotics would not be necessary right now since pus pocket has been released. Would be important to follow up with dentist to mention if there is a reoccurrence of this . - continue to monitor - follow up with dentist  Bug bite Patient describes hiking on Friday and feeling a bug bite on her left breast. She checked but did not see a bug or any lesion at the time. She woke up this morning and saw that there was a large 3.5 cm in diameter circular area of discoloration on the medial aspect of the left breast where she believes this bug bite occurred. She has been applying Benadryl to the area due to itchiness. She does not have tenderness of the area to palpation on examination, and it not swollen or warm to touch. Patient denies fevers or chills. She has not seen any bug attached to that area of the breast, and denies seeing any ticks. Given that lesion does not have typical appearing of erythema migrans and that suspected insect was not attached for 48 hrs necessary to produce rash of lyme disease, less suspicious of this. A spider bite is possible, but less likely given that patient's circular lesion has many characteristics of a well defined bruise and there are no visible bite spots. Given patient history of taking daily aspirin, it is possible that bruising could be possible 2/2 to excessive rubbing/hitting/scratching of the area after realizing presence of  insect and that this has resulted in a left breast hematoma. Patient has no breast dimpling, swelling, diffuse redness, nipple discharge, nipple bleeding, nipple inversion to suspect malignant changes of the breast; additionally, in the setting of an acute onset of these lesion after suspected insect bite, inflammatory breast changes are less likely. However, patient instructed to continue to monitor and to reach out if there is visible worsening.  - continue to monitor - continue to apply benadryl - have patient send picture of lesion through MyChart

## 2024-04-20 NOTE — Assessment & Plan Note (Addendum)
 Patient describes hiking on Friday and feeling a bug bite on her left breast. She checked but did not see a bug or any lesion at the time. She woke up this morning and saw that there was a large 3.5 cm in diameter circular area of discoloration on the medial aspect of the left breast where she believes this bug bite occurred. She has been applying Benadryl to the area due to itchiness. She does not have tenderness of the area to palpation on examination, and it not swollen or warm to touch. Patient denies fevers or chills. She has not seen any bug attached to that area of the breast, and denies seeing any ticks. Given that lesion does not have typical appearing of erythema migrans and that suspected insect was not attached for 48 hrs necessary to produce rash of lyme disease, less suspicious of this. A spider bite is possible, but less likely given that patient's circular lesion has many characteristics of a well defined bruise and there are no visible bite spots. Given patient history of taking daily aspirin, it is possible that bruising could be possible 2/2 to excessive rubbing/hitting/scratching of the area after realizing presence of insect and that this has resulted in a left breast hematoma. Patient has no breast dimpling, swelling, diffuse redness, nipple discharge, nipple bleeding, nipple inversion to suspect malignant changes of the breast; additionally, in the setting of an acute onset of these lesion after suspected insect bite, inflammatory breast changes are less likely. However, patient instructed to continue to monitor and to reach out if there is visible worsening.  - continue to monitor - continue to apply benadryl - have patient send picture of lesion through MyChart

## 2024-04-20 NOTE — Progress Notes (Signed)
 Attestation for Student Documentation:   I personally was present and re-performed the history, physical exam and medical decision-making activities of this service and have verified that the service and findings are accurately documented in the student's note.   Area of concern on lower left gum appears to be healing well. Small area of scab/ulceration between back two teeth, not painful. No overlying plaque. There is not remaining swelling, abscess, or fluctuance. Regularly sees the dentist with f/u already set for October. Recommend continued oral hygiene without trauma to the area to promote healing. Does not seem associated with the tooth given absence of pain or other findings, however Ms. Pant will discuss with dentistry as well. If recurs before that time, I have advised her to call.   Area of bruising over medial left breast following an unknown bite this weekend. Does not have the appearance or features of infection. No necrotic appearing tissue. Not painful but itchy. No systemic symptoms. Continue topical anti-itch cream and Ms. Kalmar will monitor for and reach out with changes.    SBP mildly elevated on amlodipine  10 mg. Last home check about two weeks ago wnl. Advised continued intermittent monitoring, return if consistently >140/90.    Referred for colonoscopy, recommended 3 year recall. Discussed cervical cancer screening, last completed in 12/2022. Plan to repeat in the spring, q2y.   Karna Fellows, MD 04/20/2024, 2:44 PM

## 2024-04-22 ENCOUNTER — Other Ambulatory Visit

## 2024-04-22 DIAGNOSIS — R5383 Other fatigue: Secondary | ICD-10-CM

## 2024-04-23 ENCOUNTER — Other Ambulatory Visit: Payer: Self-pay | Admitting: *Deleted

## 2024-04-23 ENCOUNTER — Ambulatory Visit: Payer: Self-pay | Admitting: Internal Medicine

## 2024-04-23 DIAGNOSIS — E611 Iron deficiency: Secondary | ICD-10-CM

## 2024-04-23 LAB — CBC WITH DIFFERENTIAL/PLATELET
Basophils Absolute: 0.1 x10E3/uL (ref 0.0–0.2)
Basos: 1 %
EOS (ABSOLUTE): 0 x10E3/uL (ref 0.0–0.4)
Eos: 1 %
Hematocrit: 38.3 % (ref 34.0–46.6)
Hemoglobin: 12.3 g/dL (ref 11.1–15.9)
Immature Grans (Abs): 0 x10E3/uL (ref 0.0–0.1)
Immature Granulocytes: 0 %
Lymphocytes Absolute: 2.2 x10E3/uL (ref 0.7–3.1)
Lymphs: 37 %
MCH: 29 pg (ref 26.6–33.0)
MCHC: 32.1 g/dL (ref 31.5–35.7)
MCV: 90 fL (ref 79–97)
Monocytes Absolute: 0.5 x10E3/uL (ref 0.1–0.9)
Monocytes: 8 %
Neutrophils Absolute: 3.2 x10E3/uL (ref 1.4–7.0)
Neutrophils: 53 %
Platelets: 271 x10E3/uL (ref 150–450)
RBC: 4.24 x10E6/uL (ref 3.77–5.28)
RDW: 14.4 % (ref 11.7–15.4)
WBC: 6.1 x10E3/uL (ref 3.4–10.8)

## 2024-04-23 LAB — IRON,TIBC AND FERRITIN PANEL
Ferritin: 16 ng/mL (ref 15–150)
Iron Saturation: 6 % — CL (ref 15–55)
Iron: 29 ug/dL (ref 27–139)
Total Iron Binding Capacity: 456 ug/dL — ABNORMAL HIGH (ref 250–450)
UIBC: 427 ug/dL — ABNORMAL HIGH (ref 118–369)

## 2024-04-23 MED ORDER — FERROUS SULFATE 325 (65 FE) MG PO TBEC: 325.0000 mg | DELAYED_RELEASE_TABLET | Freq: Every day | ORAL | 3 refills | Status: DC

## 2024-04-23 MED ORDER — FERROUS SULFATE 325 (65 FE) MG PO TBEC: 325.0000 mg | DELAYED_RELEASE_TABLET | Freq: Every day | ORAL | 3 refills | Status: AC

## 2024-04-23 NOTE — Progress Notes (Unsigned)
 Prescription for FeS04 was transferred to [facility] [address] per patient request.

## 2024-04-23 NOTE — Progress Notes (Signed)
 Iron deficiency without anemia. MyChart message sent. Start oral iron, evaluation for etiology.

## 2024-04-23 NOTE — Addendum Note (Signed)
 Addended by: KARNA FELLOWS on: 04/23/2024 09:15 AM   Modules accepted: Orders

## 2024-05-10 ENCOUNTER — Ambulatory Visit (HOSPITAL_COMMUNITY)
Admission: RE | Admit: 2024-05-10 | Discharge: 2024-05-10 | Disposition: A | Discharge status: Home / Self Care | Source: Ambulatory Visit | Attending: Family Medicine | Admitting: Family Medicine

## 2024-05-10 ENCOUNTER — Ambulatory Visit (INDEPENDENT_AMBULATORY_CARE_PROVIDER_SITE_OTHER): Admitting: Family Medicine

## 2024-05-10 ENCOUNTER — Encounter: Payer: Self-pay | Admitting: Family Medicine

## 2024-05-10 VITALS — BP 126/82 | Ht 67.0 in | Wt 187.0 lb

## 2024-05-10 DIAGNOSIS — M79662 Pain in left lower leg: Secondary | ICD-10-CM | POA: Insufficient documentation

## 2024-05-10 NOTE — Patient Instructions (Addendum)
 [facility] [address]  Check in at [place],  [place]  [address] Main: [phone]

## 2024-05-11 NOTE — Progress Notes (Signed)
 PCP: Karna Fellows, MD  Subjective:   HPI: Patient is a 68 y.o. female here for left leg pain.  6/30: Patient reports she developed pain in posterior left hamstring/distal thigh area about 1.5-2 weeks ago. No injury or trauma. Started when she was walking which she does a lot (hiking). Also golfs and walks her dog. Was in significant pain in last 1 mile of her walk and wasn't sure she'd get back to her vehicle. Tuesday she felt a lump posterior left knee. Icing has helped. No numbness or tingling or back pain. Feels better when knee is flexed.  7/9: Patient returns today to review MRI results and next steps.  Her MRI showed severe medial compartment arthritis with areas of full-thickness loss but also loose bodies, effusion, baker's cyst.  Suspect with her presentation one of these loose bodies acutely separated causing the hemarthrosis seen at last visit.  Effusion smaller today.  Went ahead with intraarticular injection today.  Icing, sleeve, tylenol, aleve if needed.  Quad strengthening and follow up in 1 month.  After informed written consent timeout was performed, patient was lying supine on exam table. Left knee was prepped with alcohol swab and utilizing superolateral approach with ultrasound guidance, patient's left knee was injected intraarticularly with 3:1 lidocaine: depomedrol. Patient tolerated the procedure well without immediate complications.  9/15: Patient reports injection last visit along with physical therapy helped her initially. Was able to get up to hiking 8 miles at a time. However having extreme posterior left knee pain after hiking this past week. Has tried icyhot, voltaren. No new acute injuries.  Past Medical History:  Diagnosis Date   Actinic keratosis of forehead 10/07/2022   Cancer (HCC)    cervical cancer   Contusion of head, subsequent encounter 11/29/2020   COVID-19    patient reported 07/2018   Diverticulitis    last epiosde 10-11 yrs ago    Eclampsia    Palpitations 10/15/2021   Poison ivy 01/27/2022   Screening for colon cancer 11/29/2020   Hx of adenomatous colonic polyps   Vertigo 11/08/2021    Current Outpatient Medications on File Prior to Visit  Medication Sig Dispense Refill   amLODipine  (NORVASC ) 10 MG tablet Take 1 tablet (10 mg total) by mouth daily. 90 tablet 3   aspirin 81 MG chewable tablet Chew 81 mg by mouth daily.     ciclopirox  (PENLAC ) 8 % solution Apply 1 Dose topically at bedtime. Apply over nail and surrounding skin. Apply daily over previous coat. After seven (7) days, may remove with alcohol and continue cycle. 6 mL 1   EPINEPHrine  0.3 mg/0.3 mL IJ SOAJ injection Inject 0.3 mg into the muscle as needed for anaphylaxis. 4 each 0   ferrous sulfate  325 (65 FE) MG EC tablet Take 1 tablet (325 mg total) by mouth daily with breakfast. 90 tablet 3   rosuvastatin  (CRESTOR ) 10 MG tablet TAKE 1 TABLET(10 MG) BY MOUTH DAILY 90 tablet 3   No current facility-administered medications on file prior to visit.    Past Surgical History:  Procedure Laterality Date   cervix removed for cervical cancer     CESAREAN SECTION     x2   COLONOSCOPY     HERNIA REPAIR     age 69   POLYPECTOMY      Allergies  Allergen Reactions   Bee Venom Shortness Of Breath   Propofol Cough    Patient received this medication during a colonoscopy for sedation.  Had significant cough  for next couple of days.  Not clear this is medication associated but will document for purposes and use in future per patient request.   Robinul [Glycopyrrolate] Cough    Patient received this medication during a colonoscopy for secretions.  Had significant cough for next couple of days.  Not clear this is medication associated but will document for purposes and use in future per patient request.    BP 126/82   Ht 5' 7 (1.702 m)   Wt 187 lb (84.8 kg)   BMI 29.29 kg/m       No data to display              No data to display               Objective:  Physical Exam:  Gen: NAD, comfortable in exam room  Left knee: Mild effusion.  No other gross deformity, ecchymoses. No joint line TTP.  Tenderness popliteal fossa with mild fullness. FROM with normal strength. Negative ant/post drawers. Negative valgus/varus testing. Negative lachman. NV intact distally.  Limited MSK u/s left knee:  Mild effusion.  Mod sized baker's cyst.  Venous structures compressible in popliteal fossa.  Assessment & Plan:  1. Left knee pain - pain radiating up posterior thigh now from popliteal fossa area.  Doppler ultrasound negative for DVT.  Prior MRI with severe arthritis.  Suspect pain is due to this.  See patient message for additional discussion with patient.  Consider aspiration/injection of cyst.  Icing, tylenol, voltaren, sleeve.  Home exercises.

## 2024-05-12 ENCOUNTER — Other Ambulatory Visit: Payer: Self-pay

## 2024-05-12 ENCOUNTER — Ambulatory Visit (INDEPENDENT_AMBULATORY_CARE_PROVIDER_SITE_OTHER): Admitting: Family Medicine

## 2024-05-12 VITALS — BP 146/80 | Ht 67.0 in | Wt 187.0 lb

## 2024-05-12 DIAGNOSIS — M25562 Pain in left knee: Secondary | ICD-10-CM

## 2024-05-12 MED ORDER — METHYLPREDNISOLONE ACETATE 40 MG/ML IJ SUSP
40.0000 mg | Freq: Once | INTRAMUSCULAR | Status: AC
  Administered 2024-05-12: 40 mg via INTRA_ARTICULAR

## 2024-05-12 NOTE — Progress Notes (Signed)
 Patient returns today after doppler ultrasound of her left lower extremity was negative for DVT.  We had discussed consideration of aspiration/injection of baker's cyst and went ahead with that today as below.  After informed written consent timeout was performed, patient was lying prone on exam table.  Left knee was prepped with chloraprep.  2mL lidocaine was used for local anesthesia.  Then utilizing ultrasound guidance patient's left knee baker's cyst was aspirated, obtaining 18mL of thick yellow fluid followed by injection of 3:1 lidocaine:depomedrol.  Patient tolerated procedure well without immediate complications.

## 2024-05-13 ENCOUNTER — Encounter: Payer: Self-pay | Admitting: Family Medicine

## 2024-05-20 ENCOUNTER — Encounter: Payer: Self-pay | Admitting: Gastroenterology

## 2024-05-31 NOTE — Addendum Note (Signed)
 Addended by: KARNA FELLOWS on: 05/31/2024 02:16 PM   Modules accepted: Orders

## 2024-06-01 ENCOUNTER — Other Ambulatory Visit

## 2024-06-01 DIAGNOSIS — E611 Iron deficiency: Secondary | ICD-10-CM | POA: Diagnosis not present

## 2024-06-02 ENCOUNTER — Ambulatory Visit: Payer: Self-pay | Admitting: Internal Medicine

## 2024-06-02 LAB — IRON,TIBC AND FERRITIN PANEL
Ferritin: 63 ng/mL (ref 15–150)
Iron Saturation: 15 % (ref 15–55)
Iron: 53 ug/dL (ref 27–139)
Total Iron Binding Capacity: 360 ug/dL (ref 250–450)
UIBC: 307 ug/dL (ref 118–369)

## 2024-06-02 NOTE — Progress Notes (Signed)
 Improving iron studies on oral supplementation. Continue oral iron for now, pending GI evaluation.

## 2024-06-07 ENCOUNTER — Other Ambulatory Visit: Payer: Self-pay

## 2024-06-07 DIAGNOSIS — M25562 Pain in left knee: Secondary | ICD-10-CM

## 2024-06-11 ENCOUNTER — Other Ambulatory Visit: Payer: Self-pay | Admitting: Medical Genetics

## 2024-06-11 DIAGNOSIS — Z006 Encounter for examination for normal comparison and control in clinical research program: Secondary | ICD-10-CM

## 2024-06-14 ENCOUNTER — Telehealth (HOSPITAL_BASED_OUTPATIENT_CLINIC_OR_DEPARTMENT_OTHER): Payer: Self-pay | Admitting: *Deleted

## 2024-06-14 DIAGNOSIS — M1712 Unilateral primary osteoarthritis, left knee: Secondary | ICD-10-CM | POA: Diagnosis not present

## 2024-06-14 NOTE — Telephone Encounter (Signed)
   Name: Miranda Lopez  DOB: 09-08-1955  MRN: [id number]  Primary Cardiologist: Maude Emmer, MD  Chart reviewed as part of pre-operative protocol coverage. Because of Lenix Benoist Mallo's past medical history and time since last visit, she will require a follow-up in-office visit in order to better assess preoperative cardiovascular risk.  Pre-op covering staff: - Please schedule appointment and call patient to inform them. If patient already had an upcoming appointment within acceptable timeframe, please add pre-op clearance to the appointment notes so provider is aware. - Please contact requesting surgeon's office via preferred method (i.e, phone, fax) to inform them of need for appointment prior to surgery.   Lyndy Russman D Foster Sonnier, NP  06/14/2024, 5:23 PM

## 2024-06-14 NOTE — Telephone Encounter (Signed)
   Pre-operative Risk Assessment    Patient Name: Miranda Lopez  DOB: [birth year] MRN: [id number]   Date of last office visit: 11/30/21 DR. NISHAN Date of next office visit: NONE   Request for Surgical Clearance    Procedure:  LEFT TOTAL KNEE ARTHROPLASTY   Date of Surgery:  Clearance TBD                                Surgeon:  DR. FONDA OLMSTED Surgeon's Group or Practice Name:  [facility] Phone number:  [phone] Fax number:  [phone] SHERRI GAVIN   Type of Clearance Requested:   - Medical  - Pharmacy:  Hold Aspir[facility]     Type of Anesthesia:  Spinal   Additional requests/questions:    Bonney Niels Jest   06/14/2024, 4:55 PM

## 2024-06-15 NOTE — Telephone Encounter (Signed)
 Pt has been scheduled to see Jon Hails, [facility] 07/01/24. Pt tells me surgery will be after 07/07/24, but wanted to get the preop done so they can schedule her surgery.

## 2024-06-21 ENCOUNTER — Ambulatory Visit

## 2024-06-21 VITALS — Ht 67.0 in | Wt 189.0 lb

## 2024-06-21 DIAGNOSIS — Z860100 Personal history of colon polyps, unspecified: Secondary | ICD-10-CM

## 2024-06-21 DIAGNOSIS — M7122 Synovial cyst of popliteal space [Baker], left knee: Secondary | ICD-10-CM | POA: Diagnosis not present

## 2024-06-21 DIAGNOSIS — K649 Unspecified hemorrhoids: Secondary | ICD-10-CM

## 2024-06-21 MED ORDER — NA SULFATE-K SULFATE-MG SULF 17.5-3.13-1.6 GM/177ML PO SOLN: 1.0000 | Freq: Once | ORAL | 0 refills | Status: AC

## 2024-06-21 NOTE — Progress Notes (Signed)
 No egg or soy allergy known to patient  No issues known to pt with past sedation with any surgeries or procedures Patient denies ever being told they had issues or difficulty with intubation  No FH of Malignant Hyperthermia Pt is not on diet pills Pt is not on  home 02  Pt is not on blood thinners  Pt denies issues with constipation but takes Miralax No A fib or A flutter Have any cardiac testing pending--no Pt can ambulate independently Pt denies use of chewing tobacco Discussed diabetic I weight loss medication holds Discussed NSAID holds Checked BMI Pt instructed to use Singlecare.com or GoodRx for a price reduction on prep  Patient's chart reviewed by Norleen Schillings CNRA prior to previsit and patient appropriate for the [facility].  Pre visit completed and red dot placed by patient's name on their procedure day (on provider's schedule).

## 2024-06-29 ENCOUNTER — Encounter: Payer: Self-pay | Admitting: Gastroenterology

## 2024-07-01 ENCOUNTER — Ambulatory Visit: Admitting: Physician Assistant

## 2024-07-06 ENCOUNTER — Telehealth: Payer: Self-pay | Admitting: Internal Medicine

## 2024-07-06 DIAGNOSIS — D122 Benign neoplasm of ascending colon: Secondary | ICD-10-CM | POA: Diagnosis not present

## 2024-07-06 NOTE — Telephone Encounter (Signed)
 Please refer to message below.  Reached out to patient at the request of Dr. Karna to schedule an appointment for surgical clearance.  During our conversation, patient made me aware that she will not be having the surgery done.  Forwarding message to Dr. Karna to make her aware.

## 2024-07-06 NOTE — Telephone Encounter (Signed)
-----   Message from Ronnald Sergeant sent at 07/02/2024  8:37 AM EST ----- Regarding: Surgical clearance paperwork Received surgical clearance form for total knee arthroplasty for Miranda Lopez. She will need an in-person appointment for completion. A date after her colonoscopy on 11/12 would be preferable. Thank you.

## 2024-07-07 ENCOUNTER — Ambulatory Visit: Admitting: Gastroenterology

## 2024-07-07 ENCOUNTER — Encounter: Payer: Self-pay | Admitting: Gastroenterology

## 2024-07-07 VITALS — BP 115/75 | HR 57 | Temp 98.1°F | Resp 13 | Ht 67.0 in | Wt 189.0 lb

## 2024-07-07 DIAGNOSIS — Z1211 Encounter for screening for malignant neoplasm of colon: Secondary | ICD-10-CM

## 2024-07-07 DIAGNOSIS — E785 Hyperlipidemia, unspecified: Secondary | ICD-10-CM | POA: Diagnosis not present

## 2024-07-07 DIAGNOSIS — D122 Benign neoplasm of ascending colon: Secondary | ICD-10-CM

## 2024-07-07 DIAGNOSIS — K648 Other hemorrhoids: Secondary | ICD-10-CM | POA: Diagnosis not present

## 2024-07-07 DIAGNOSIS — Z860101 Personal history of adenomatous and serrated colon polyps: Secondary | ICD-10-CM | POA: Diagnosis not present

## 2024-07-07 DIAGNOSIS — K644 Residual hemorrhoidal skin tags: Secondary | ICD-10-CM | POA: Diagnosis not present

## 2024-07-07 DIAGNOSIS — Z860100 Personal history of colon polyps, unspecified: Secondary | ICD-10-CM

## 2024-07-07 DIAGNOSIS — K649 Unspecified hemorrhoids: Secondary | ICD-10-CM

## 2024-07-07 DIAGNOSIS — I10 Essential (primary) hypertension: Secondary | ICD-10-CM | POA: Diagnosis not present

## 2024-07-07 MED ORDER — SODIUM CHLORIDE 0.9 % IV SOLN: 500.0000 mL | INTRAVENOUS | Status: DC

## 2024-07-07 NOTE — Progress Notes (Signed)
 Report given to PACU, vss

## 2024-07-07 NOTE — Progress Notes (Signed)
 [id number] Patient experiencing nausea and retching.  MD updated and Zofran 4 mg IV given, vss

## 2024-07-07 NOTE — Progress Notes (Signed)
 Pt states no changes to health hx or medications since previsit.

## 2024-07-07 NOTE — Progress Notes (Signed)
 Called to room to assist during endoscopic procedure.  Patient ID and intended procedure confirmed with present staff. Received instructions for my participation in the procedure from the performing physician.

## 2024-07-07 NOTE — Op Note (Signed)
 [facility] Patient Name: Miranda Lopez Procedure Date: 07/07/2024 9:27 AM MRN: [id number] Endoscopist: Aloha Finner , MD, [id number] Age: 68 Referring MD:  Date of Birth: 1956/04/06 Gender: Female Account #: [id number] Procedure:                Colonoscopy Indications:              Surveillance: Personal history of adenomatous                            polyps on last colonoscopy 3 years ago Medicines:                Monitored Anesthesia Care Procedure:                After obtaining informed consent, the colonoscope                            was passed under direct vision. Throughout the                            procedure, the patient's blood pressure, pulse, and                            oxygen saturations were monitored continuously. The                            CF [id number] #[id number] was introduced through the anus                            and advanced to the 3 cm into the ileum. The                            colonoscopy was performed without difficulty. The                            patient tolerated the procedure. The quality of the                            bowel preparation was adequate. The terminal ileum,                            ileocecal valve, appendiceal orifice, and rectum                            were photographed. Scope In: 9:47:00 AM Scope Out: 10:03:01 AM Scope Withdrawal Time: 0 hours 12 minutes 51 seconds  Total Procedure Duration: 0 hours 16 minutes 1 second  Findings:                 Skin tags were found on perianal exam.                           The digital rectal exam findings include                            hemorrhoids. Pertinent negatives include no  palpable rectal lesions.                           The left colon was mildly tortuous.                           A large amount of liquid semi-liquid stool was                            found in the entire colon, interfering with                             visualization. Lavage of the area was performed                            using copious amounts, resulting in clearance with                            adequate visualization.                           The terminal ileum and ileocecal valve appeared                            normal.                           Two sessile polyps were found in the ascending                            colon. The polyps were 3 to 10 mm in size. These                            polyps were removed with a cold snare. Resection                            and retrieval were complete.                           Multiple medium-mouthed and small-mouthed                            diverticula were found in the entire colon.                           Normal mucosa was found in the entire colon                            otherwise.                           Non-bleeding non-thrombosed internal hemorrhoids                            were found during retroflexion, during perianal  exam and during digital exam. The hemorrhoids were                            Grade II (internal hemorrhoids that prolapse but                            reduce spontaneously). Complications:            No immediate complications. Estimated Blood Loss:     Estimated blood loss was minimal. Impression:               - Perianal skin tags found on perianal exam.                           - Hemorrhoids found on digital rectal exam.                           - Tortuous colon.                           - Stool in the entire examined colon. Lavaged with                            adequate visualization.                           - The examined portion of the ileum was normal.                           - Two 3 to 10 mm polyps in the ascending colon,                            removed with a cold snare. Resected and retrieved.                           - Diverticulosis in the entire examined colon.                            - Normal mucosa in the entire examined colon                            otherwise.                           - Non-bleeding non-thrombosed internal hemorrhoids. Recommendation:           - The patient will be observed post-procedure,                            until all discharge criteria are met.                           - Discharge patient to home.                           - Patient has a contact number available for  emergencies. The signs and symptoms of potential                            delayed complications were discussed with the                            patient. Return to normal activities tomorrow.                            Written discharge instructions were provided to the                            patient.                           - High fiber diet.                           - Use FiberCon 1-2 tablets PO daily.                           - Continue present medications.                           - Await pathology results.                           - Repeat colonoscopy in 3 years for surveillance.                           - Patient mentioned, though not reported previously                            that she had Iron deficiency found earlier this                            year and then supplemented and improved to normal.                            She has no significant upper GI symptoms. I                            recommend however, with the noted ID this year that                            we perform an EGD. If she defers then will need to                            monitor iron levels and if they drop again,                            certainly needs EGD.                           - The findings and recommendations  were discussed                            with the patient.                           - The findings and recommendations were discussed                            with the patient's family. Aloha Finner,  MD 07/07/2024 10:09:12 AM

## 2024-07-07 NOTE — Progress Notes (Signed)
 GASTROENTEROLOGY PROCEDURE H&P NOTE   Primary Care Physician: Karna Fellows, MD  HPI: Miranda Lopez is a 68 y.o. female who presents for Colonoscopy for surveillance of previous adenomas.  Past Medical History:  Diagnosis Date   Actinic keratosis of forehead 10/07/2022   Cancer (HCC)    cervical cancer   Contusion of head, subsequent encounter 11/29/2020   COVID-19    patient reported 07/2018   Diverticulitis    last epiosde 10-11 yrs ago   Eclampsia    Heart murmur    Hyperlipidemia    Hypertension    Palpitations 10/15/2021   Poison ivy 01/27/2022   Screening for colon cancer 11/29/2020   Hx of adenomatous colonic polyps   Vertigo 11/08/2021   Past Surgical History:  Procedure Laterality Date   cervix removed for cervical cancer     CESAREAN SECTION     x2   COLONOSCOPY     HERNIA REPAIR     age 47   POLYPECTOMY     Current Outpatient Medications  Medication Sig Dispense Refill   amLODipine  (NORVASC ) 10 MG tablet Take 1 tablet (10 mg total) by mouth daily. 90 tablet 3   aspirin 81 MG chewable tablet Chew 81 mg by mouth daily.     ciclopirox  (PENLAC ) 8 % solution Apply 1 Dose topically at bedtime. Apply over nail and surrounding skin. Apply daily over previous coat. After seven (7) days, may remove with alcohol and continue cycle. 6 mL 1   EPINEPHrine  0.3 mg/0.3 mL IJ SOAJ injection Inject 0.3 mg into the muscle as needed for anaphylaxis. 4 each 0   ferrous sulfate  325 (65 FE) MG EC tablet Take 1 tablet (325 mg total) by mouth daily with breakfast. 90 tablet 3   rosuvastatin  (CRESTOR ) 10 MG tablet TAKE 1 TABLET(10 MG) BY MOUTH DAILY 90 tablet 3   No current facility-administered medications for this visit.    Current Outpatient Medications:    amLODipine  (NORVASC ) 10 MG tablet, Take 1 tablet (10 mg total) by mouth daily., Disp: 90 tablet, Rfl: 3   aspirin 81 MG chewable tablet, Chew 81 mg by mouth daily., Disp: , Rfl:    ciclopirox  (PENLAC ) 8 % solution,  Apply 1 Dose topically at bedtime. Apply over nail and surrounding skin. Apply daily over previous coat. After seven (7) days, may remove with alcohol and continue cycle., Disp: 6 mL, Rfl: 1   EPINEPHrine  0.3 mg/0.3 mL IJ SOAJ injection, Inject 0.3 mg into the muscle as needed for anaphylaxis., Disp: 4 each, Rfl: 0   ferrous sulfate  325 (65 FE) MG EC tablet, Take 1 tablet (325 mg total) by mouth daily with breakfast., Disp: 90 tablet, Rfl: 3   rosuvastatin  (CRESTOR ) 10 MG tablet, TAKE 1 TABLET(10 MG) BY MOUTH DAILY, Disp: 90 tablet, Rfl: 3 Allergies  Allergen Reactions   Bee Venom Shortness Of Breath   Propofol Cough    Patient received this medication during a colonoscopy for sedation.  Had significant cough for next couple of days.  Not clear this is medication associated but will document for purposes and use in future per patient request.   Robinul [Glycopyrrolate] Cough    Patient received this medication during a colonoscopy for secretions.  Had significant cough for next couple of days.  Not clear this is medication associated but will document for purposes and use in future per patient request.   Family History  Problem Relation Age of Onset   Heart failure Father 38  CAD Father 49       Heart attack and quadruple bypass at age 54.   Breast cancer Paternal Aunt    Colon cancer Neg Hx    Colon polyps Neg Hx    Esophageal cancer Neg Hx    Rectal cancer Neg Hx    Stomach cancer Neg Hx    Social History   Socioeconomic History   Marital status: Single    Spouse name: Not on file   Number of children: Not on file   Years of education: Not on file   Highest education level: Not on file  Occupational History   Not on file  Tobacco Use   Smoking status: Never   Smokeless tobacco: Never  Vaping Use   Vaping status: Never Used  Substance and Sexual Activity   Alcohol use: Yes    Alcohol/week: 7.0 standard drinks of alcohol    Types: 7 Glasses of wine per week    Comment:  wine with dinner nightly   Drug use: Never   Sexual activity: Not on file  Other Topics Concern   Not on file  Social History Narrative   Currently working, owns two businesses.   Social Drivers of Corporate Investment Banker Strain: Low Risk  (02/18/2024)   Overall Financial Resource Strain (CARDIA)    Difficulty of Paying Living Expenses: Not hard at all  Food Insecurity: No Food Insecurity (09/16/2023)   Hunger Vital Sign    Worried About Running Out of Food in the Last Year: Never true    Ran Out of Food in the Last Year: Never true  Transportation Needs: Unknown (09/16/2023)   PRAPARE - Administrator, Civil Service (Medical): No    Lack of Transportation (Non-Medical): Not on file  Physical Activity: Sufficiently Active (02/18/2024)   Exercise Vital Sign    Days of Exercise per Week: 7 days    Minutes of Exercise per Session: 120 min  Stress: Patient Declined (10/07/2022)   [employer] of Occupational Health - Occupational Stress Questionnaire    Feeling of Stress : Patient declined  Social Connections: Moderately Integrated (02/18/2024)   Social Connection and Isolation Panel    Frequency of Communication with Friends and Family: More than three times a week    Frequency of Social Gatherings with Friends and Family: More than three times a week    Attends Religious Services: More than 4 times per year    Active Member of [employer] or Organizations: Yes    Attends Banker Meetings: 1 to 4 times per year    Marital Status: Divorced  Intimate Partner Violence: Not At Risk (09/16/2023)   Humiliation, Afraid, Rape, and Kick questionnaire    Fear of Current or Ex-Partner: No    Emotionally Abused: No    Physically Abused: No    Sexually Abused: No    Physical Exam: There were no vitals filed for this visit. There is no height or weight on file to calculate BMI. GEN: NAD EYE: Sclerae anicteric ENT: MMM CV: Non-tachycardic GI: Soft, NT/ND NEURO:   Alert & Oriented x 3  Lab Results: No results for input(s): WBC, HGB, HCT, PLT in the last 72 hours. BMET No results for input(s): NA, K, CL, CO2, GLUCOSE, BUN, CREATININE, CALCIUM  in the last 72 hours. LFT No results for input(s): PROT, ALBUMIN, AST, ALT, ALKPHOS, BILITOT, BILIDIR, IBILI in the last 72 hours. PT/INR No results for input(s): LABPROT, INR in the last 72  hours.   Impression / Plan: This is a 68 y.o.female who presents for Colonoscopy for surveillance of previous adenomas.  The risks and benefits of endoscopic evaluation/treatment were discussed with the patient and/or family; these include but are not limited to the risk of perforation, infection, bleeding, missed lesions, lack of diagnosis, severe illness requiring hospitalization, as well as anesthesia and sedation related illnesses.  The patient's history has been reviewed, patient examined, no change in status, and deemed stable for procedure.  The patient and/or family was provided an opportunity to ask questions and all were answered.  The patient and/or family is agreeable to proceed.    Aloha Finner, MD [facility] Office # [phone]

## 2024-07-07 NOTE — Patient Instructions (Signed)
 YOU HAD AN ENDOSCOPIC PROCEDURE TODAY AT THE [facility]:   Refer to the procedure report that was given to you for any specific questions about what was found during the examination.  If the procedure report does not answer your questions, please call your gastroenterologist to clarify.  If you requested that your care partner not be given the details of your procedure findings, then the procedure report has been included in a sealed envelope for you to review at your convenience later.  YOU SHOULD EXPECT: Some feelings of bloating in the abdomen. Passage of more gas than usual.  Walking can help get rid of the air that was put into your GI tract during the procedure and reduce the bloating. If you had a lower endoscopy (such as a colonoscopy or flexible sigmoidoscopy) you may notice spotting of blood in your stool or on the toilet paper. If you underwent a bowel prep for your procedure, you may not have a normal bowel movement for a few days.  Please Note:  You might notice some irritation and congestion in your nose or some drainage.  This is from the oxygen used during your procedure.  There is no need for concern and it should clear up in a day or so.  SYMPTOMS TO REPORT IMMEDIATELY:  Following lower endoscopy (colonoscopy or flexible sigmoidoscopy):  Excessive amounts of blood in the stool  Significant tenderness or worsening of abdominal pains  Swelling of the abdomen that is new, acute  Fever of 100F or higher  High fiber diet Use FiberCon  1-2 tablets by mouth daily Continue present medications Await pathology results Repeat colonoscopy in 3 years for surveillance Handouts on diverticulosis, polyps and hemorrhoids given   For urgent or emergent issues, a gastroenterologist can be reached at any hour by calling [phone]. Do not use MyChart messaging for urgent concerns.    DIET:  We do recommend a small meal at first, but then you may proceed to your regular  diet.  Drink plenty of fluids but you should avoid alcoholic beverages for 24 hours.  ACTIVITY:  You should plan to take it easy for the rest of today and you should NOT DRIVE or use heavy machinery until tomorrow (because of the sedation medicines used during the test).    FOLLOW UP: Our staff will call the number listed on your records the next business day following your procedure.  We will call around 7:15- 8:00 am to check on you and address any questions or concerns that you may have regarding the information given to you following your procedure. If we do not reach you, we will leave a message.     If any biopsies were taken you will be contacted by phone or by letter within the next 1-3 weeks.  Please call us  at [phone] if you have not heard about the biopsies in 3 weeks.    SIGNATURES/CONFIDENTIALITY: You and/or your care partner have signed paperwork which will be entered into your electronic medical record.  These signatures attest to the fact that that the information above on your After Visit Summary has been reviewed and is understood.  Full responsibility of the confidentiality of this discharge information lies with you and/or your care-partner.

## 2024-07-08 ENCOUNTER — Other Ambulatory Visit: Payer: Self-pay

## 2024-07-08 ENCOUNTER — Telehealth: Payer: Self-pay

## 2024-07-08 DIAGNOSIS — D509 Iron deficiency anemia, unspecified: Secondary | ICD-10-CM

## 2024-07-08 NOTE — Progress Notes (Signed)
 Lab order for 6 weeks has been placed.

## 2024-07-08 NOTE — Telephone Encounter (Signed)
 No answer on follow-up call. Left VM for pt.

## 2024-07-09 LAB — SURGICAL PATHOLOGY

## 2024-07-11 ENCOUNTER — Ambulatory Visit: Payer: Self-pay | Admitting: Gastroenterology

## 2024-07-12 ENCOUNTER — Encounter: Payer: Self-pay | Admitting: Internal Medicine

## 2024-07-20 ENCOUNTER — Ambulatory Visit: Admitting: Internal Medicine
